# Patient Record
Sex: Female | Born: 1990 | Race: White | Hispanic: No | Marital: Single | Smoking: Never smoker
Health system: Southern US, Community
[De-identification: ages and names within clinical notes are randomized; demographics above are authoritative.]

## PROBLEM LIST (undated history)

## (undated) DIAGNOSIS — L509 Urticaria, unspecified: Secondary | ICD-10-CM

## (undated) DIAGNOSIS — N809 Endometriosis, unspecified: Secondary | ICD-10-CM

## (undated) DIAGNOSIS — F419 Anxiety disorder, unspecified: Secondary | ICD-10-CM

## (undated) DIAGNOSIS — F32A Depression, unspecified: Secondary | ICD-10-CM

## (undated) DIAGNOSIS — T783XXA Angioneurotic edema, initial encounter: Secondary | ICD-10-CM

## (undated) DIAGNOSIS — J45909 Unspecified asthma, uncomplicated: Secondary | ICD-10-CM

## (undated) DIAGNOSIS — F329 Major depressive disorder, single episode, unspecified: Secondary | ICD-10-CM

## (undated) HISTORY — DX: Urticaria, unspecified: L50.9

## (undated) HISTORY — DX: Angioneurotic edema, initial encounter: T78.3XXA

## (undated) HISTORY — DX: Unspecified asthma, uncomplicated: J45.909

## (undated) HISTORY — PX: EXTERNAL EAR SURGERY: SHX627

## (undated) HISTORY — DX: Anxiety disorder, unspecified: F41.9

---
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
## 2010-04-22 ENCOUNTER — Ambulatory Visit: Payer: Self-pay | Admitting: Family Medicine

## 2011-06-08 ENCOUNTER — Ambulatory Visit: Payer: Self-pay

## 2014-03-19 ENCOUNTER — Ambulatory Visit: Payer: Self-pay | Admitting: Physician Assistant

## 2014-04-09 DIAGNOSIS — M25539 Pain in unspecified wrist: Secondary | ICD-10-CM | POA: Insufficient documentation

## 2014-05-15 DIAGNOSIS — S6980XA Other specified injuries of unspecified wrist, hand and finger(s), initial encounter: Secondary | ICD-10-CM

## 2014-05-15 HISTORY — DX: Other specified injuries of unspecified wrist, hand and finger(s), initial encounter: S69.80XA

## 2015-10-08 DIAGNOSIS — J4520 Mild intermittent asthma, uncomplicated: Secondary | ICD-10-CM | POA: Insufficient documentation

## 2015-10-08 DIAGNOSIS — J4521 Mild intermittent asthma with (acute) exacerbation: Secondary | ICD-10-CM | POA: Insufficient documentation

## 2017-01-01 ENCOUNTER — Ambulatory Visit
Admission: EM | Admit: 2017-01-01 | Discharge: 2017-01-01 | Disposition: A | Discharge status: Home / Self Care | Payer: BLUE CROSS/BLUE SHIELD | Attending: Family Medicine | Admitting: Family Medicine

## 2017-01-01 ENCOUNTER — Encounter: Payer: Self-pay | Admitting: Emergency Medicine

## 2017-01-01 DIAGNOSIS — B9689 Other specified bacterial agents as the cause of diseases classified elsewhere: Secondary | ICD-10-CM | POA: Diagnosis not present

## 2017-01-01 DIAGNOSIS — N390 Urinary tract infection, site not specified: Secondary | ICD-10-CM

## 2017-01-01 DIAGNOSIS — N760 Acute vaginitis: Secondary | ICD-10-CM | POA: Diagnosis not present

## 2017-01-01 LAB — URINALYSIS, COMPLETE (UACMP) WITH MICROSCOPIC
Bilirubin Urine: NEGATIVE
GLUCOSE, UA: NEGATIVE mg/dL
KETONES UR: NEGATIVE mg/dL
Nitrite: NEGATIVE
PROTEIN: 30 mg/dL — AB
Specific Gravity, Urine: >1.03 — ABNORMAL HIGH (ref 1.005–1.030)
pH: 7 (ref 5.0–8.0)

## 2017-01-01 LAB — WET PREP, GENITAL
Sperm: NONE SEEN
Trich, Wet Prep: NONE SEEN
Yeast Wet Prep HPF POC: NONE SEEN

## 2017-01-01 LAB — CHLAMYDIA/NGC RT PCR (ARMC ONLY)
Chlamydia Tr: NOT DETECTED
N gonorrhoeae: NOT DETECTED

## 2017-01-01 MED ORDER — CEPHALEXIN 500 MG PO CAPS: 500.0000 mg | ORAL_CAPSULE | Freq: Two times a day (BID) | ORAL | 0 refills | Status: DC

## 2017-01-01 MED ORDER — TRIAMCINOLONE ACETONIDE 0.1 % EX CREA: 1.0000 "application " | TOPICAL_CREAM | Freq: Two times a day (BID) | CUTANEOUS | 0 refills | Status: DC

## 2017-01-01 MED ORDER — METRONIDAZOLE 500 MG PO TABS: 500.0000 mg | ORAL_TABLET | Freq: Two times a day (BID) | ORAL | 0 refills | Status: DC

## 2017-01-01 NOTE — ED Triage Notes (Signed)
Patient c/o burning when urinating and vaginal itching and irritation that started 2 days ago.

## 2017-01-01 NOTE — ED Provider Notes (Signed)
CSN: [id number]     Arrival date & time 01/01/17  0915 History   First MD Initiated Contact with Patient 01/01/17 1006     Chief Complaint  Patient presents with  . Vaginal Itching   (Consider location/radiation/quality/duration/timing/severity/associated sxs/prior Treatment) HPI  26 year old female who presents with burning when urinating along with vaginal itching and irritation with white creamy discharge that started 2 days ago. Says she has burning with urination she states is severe. She has had one partner sexually active. Does admit to dyspareunia recently during the infection. She did have a fever earlier in the illness to 100.4        History reviewed. No pertinent past medical history. Past Surgical History:  Procedure Laterality Date  . EXTERNAL EAR SURGERY     History reviewed. No pertinent family history. Social History  Substance Use Topics  . Smoking status: Never Smoker  . Smokeless tobacco: Never Used  . Alcohol use Yes   OB History    No data available     Review of Systems  Constitutional: Positive for activity change, chills and fever.  Genitourinary: Positive for dysuria, frequency, urgency, vaginal discharge and vaginal pain.  All other systems reviewed and are negative.   Allergies  Patient has no known allergies.  Home Medications   Prior to Admission medications   Medication Sig Start Date End Date Taking? Authorizing Provider  escitalopram (LEXAPRO) 20 MG tablet Take 20 mg by mouth daily.   Yes Historical Provider, MD  cephALEXin (KEFLEX) 500 MG capsule Take 1 capsule (500 mg total) by mouth 2 (two) times daily. 01/01/17   Lutricia Feil, PA-C  metroNIDAZOLE (FLAGYL) 500 MG tablet Take 1 tablet (500 mg total) by mouth 2 (two) times daily. 01/01/17   Lutricia Feil, PA-C  triamcinolone cream (KENALOG) 0.1 % Apply 1 application topically 2 (two) times daily. 01/01/17   Lutricia Feil, PA-C   Meds Ordered and Administered this Visit   Medications - No data to display  BP 125/80 (BP Location: Left Arm)   Pulse 95   Temp 98.4 F (36.9 C) (Oral)   Resp 16   Ht 5\' 1"  (1.549 m)   Wt 110 lb (49.9 kg)   LMP 12/16/2016 (Exact Date)   SpO2 100%   BMI 20.78 kg/m  No data found.   Physical Exam  Constitutional: She is oriented to person, place, and time. She appears well-developed and well-nourished. No distress.  HENT:  Head: Normocephalic and atraumatic.  Eyes: Pupils are equal, round, and reactive to light.  Neck: Normal range of motion.  Abdominal: Soft. Bowel sounds are normal. She exhibits no distension. There is no tenderness. There is no rebound and no guarding.  Genitourinary: Uterus normal. Vaginal discharge found.  Genitourinary Comments: Pelvic exam was performed with Herbert Seta, RN as chaperone and assisted. External genitalia so showed discharge at the introitus which was creamy and thick. She was extremely irritated at the introitus and on the labia. No vesicles were seen. Pelvic exam showed copious amount of white thick discharge This covered the fornix and the cervical os. Samples were obtained and sent to the laboratory. A speculum was removed and a bimanual showed no cervical motion tenderness no adnexal tenderness. The patient complained of severe pain over the external genitalia during the bi manual.  Musculoskeletal: Normal range of motion.  Neurological: She is alert and oriented to person, place, and time.  Skin: Skin is warm and dry. She is not diaphoretic.  Psychiatric:  She has a normal mood and affect. Her behavior is normal. Judgment and thought content normal.  Nursing note and vitals reviewed.   Urgent Care Course     Procedures (including critical care time)  Labs Review Labs Reviewed  WET PREP, GENITAL - Abnormal; Notable for the following:       Result Value   Clue Cells Wet Prep HPF POC PRESENT (*)    WBC, Wet Prep HPF POC FEW (*)    All other components within normal limits   URINALYSIS, COMPLETE (UACMP) WITH MICROSCOPIC - Abnormal; Notable for the following:    APPearance CLOUDY (*)    Specific Gravity, Urine >1.030 (*)    Hgb urine dipstick SMALL (*)    Protein, ur 30 (*)    Leukocytes, UA LARGE (*)    Squamous Epithelial / LPF TOO NUMEROUS TO COUNT (*)    Bacteria, UA FEW (*)    All other components within normal limits  CHLAMYDIA/NGC RT PCR ([facility] ONLY)  URINE CULTURE    Imaging Review No results found.   Visual Acuity Review  Right Eye Distance:   Left Eye Distance:   Bilateral Distance:    Right Eye Near:   Left Eye Near:    Bilateral Near:         MDM   1. BV (bacterial vaginosis)   2. Urinary tract infection without hematuria, site unspecified    Discharge Medication List as of 01/01/2017 11:20 AM    START taking these medications   Details  cephALEXin (KEFLEX) 500 MG capsule Take 1 capsule (500 mg total) by mouth 2 (two) times daily., Starting Fri 01/01/2017, Normal    metroNIDAZOLE (FLAGYL) 500 MG tablet Take 1 tablet (500 mg total) by mouth 2 (two) times daily., Starting Fri 01/01/2017, Normal    triamcinolone cream (KENALOG) 0.1 % Apply 1 application topically 2 (two) times daily., Starting Fri 01/01/2017, Normal      Plan: 1. Test/x-ray results and diagnosis reviewed with patient 2. rx as per orders; risks, benefits, potential side effects reviewed with patient 3. Recommend supportive treatment with abstain from sex during treatment. Regarding the use of alcohol while taking Flagyl. She is not improving she should follow-up with the GYN.  4. F/u prn if symptoms worsen or don't improve     Lutricia FeilWilliam P Roemer, PA-C 01/01/17 2020

## 2017-01-02 LAB — URINE CULTURE: Culture: <10000 — AB

## 2017-01-03 DIAGNOSIS — N7689 Other specified inflammation of vagina and vulva: Secondary | ICD-10-CM | POA: Insufficient documentation

## 2017-01-03 DIAGNOSIS — R102 Pelvic and perineal pain: Secondary | ICD-10-CM | POA: Diagnosis present

## 2017-01-03 NOTE — ED Triage Notes (Addendum)
Pt was seen at [facility] 2 days ago and diagnosed with bacterial vaginosis and UTI; taking medication as prescribed but not feeling any better; c/o continued dysuria, dizziness and tingling in arms; pt awake and oriented x 3; talking in complete coherent sentences

## 2017-01-04 ENCOUNTER — Encounter: Payer: Self-pay | Admitting: Emergency Medicine

## 2017-01-04 ENCOUNTER — Emergency Department
Admission: EM | Admit: 2017-01-04 | Discharge: 2017-01-04 | Disposition: A | Discharge status: Home / Self Care | Payer: BLUE CROSS/BLUE SHIELD | Attending: Emergency Medicine | Admitting: Emergency Medicine

## 2017-01-04 DIAGNOSIS — L989 Disorder of the skin and subcutaneous tissue, unspecified: Secondary | ICD-10-CM

## 2017-01-04 DIAGNOSIS — N7689 Other specified inflammation of vagina and vulva: Secondary | ICD-10-CM

## 2017-01-04 LAB — CBC WITH DIFFERENTIAL/PLATELET
BASOS ABS: 0 10*3/uL (ref 0–0.1)
BASOS PCT: 1 %
EOS ABS: 0.1 10*3/uL (ref 0–0.7)
EOS PCT: 2 %
HCT: 40.2 % (ref 35.0–47.0)
Hemoglobin: 13.6 g/dL (ref 12.0–16.0)
Lymphocytes Relative: 33 %
Lymphs Abs: 1.7 10*3/uL (ref 1.0–3.6)
MCH: 29.7 pg (ref 26.0–34.0)
MCHC: 33.8 g/dL (ref 32.0–36.0)
MCV: 88.1 fL (ref 80.0–100.0)
MONO ABS: 0.7 10*3/uL (ref 0.2–0.9)
Monocytes Relative: 13 %
Neutro Abs: 2.6 10*3/uL (ref 1.4–6.5)
Neutrophils Relative %: 51 %
PLATELETS: 209 10*3/uL (ref 150–440)
RBC: 4.57 MIL/uL (ref 3.80–5.20)
RDW: 13.8 % (ref 11.5–14.5)
WBC: 5.1 10*3/uL (ref 3.6–11.0)

## 2017-01-04 LAB — BASIC METABOLIC PANEL
Anion gap: 5 (ref 5–15)
BUN: 17 mg/dL (ref 6–20)
CALCIUM: 8.9 mg/dL (ref 8.9–10.3)
CHLORIDE: 107 mmol/L (ref 101–111)
CO2: 26 mmol/L (ref 22–32)
Creatinine, Ser: 0.67 mg/dL (ref 0.44–1.00)
GFR calc Af Amer: >60 mL/min (ref >60)
Glucose, Bld: 109 mg/dL — ABNORMAL HIGH (ref 65–99)
Potassium: 4.1 mmol/L (ref 3.5–5.1)
SODIUM: 138 mmol/L (ref 135–145)

## 2017-01-04 MED ORDER — HYDROCODONE-ACETAMINOPHEN 5-325 MG PO TABS
1.0000 | ORAL_TABLET | Freq: Once | ORAL | Status: AC
  Administered 2017-01-04: 1 via ORAL
  Filled 2017-01-04: qty 1

## 2017-01-04 MED ORDER — PREDNISONE 20 MG PO TABS: ORAL_TABLET | ORAL | 0 refills | Status: DC

## 2017-01-04 MED ORDER — ONDANSETRON 4 MG PO TBDP
4.0000 mg | ORAL_TABLET | Freq: Once | ORAL | Status: AC
  Administered 2017-01-04: 4 mg via ORAL
  Filled 2017-01-04: qty 1

## 2017-01-04 MED ORDER — HYDROCODONE-ACETAMINOPHEN 5-325 MG PO TABS: 1.0000 | ORAL_TABLET | Freq: Four times a day (QID) | ORAL | 0 refills | Status: DC | PRN

## 2017-01-04 MED ORDER — PREDNISONE 20 MG PO TABS
40.0000 mg | ORAL_TABLET | Freq: Once | ORAL | Status: AC
  Administered 2017-01-04: 40 mg via ORAL
  Filled 2017-01-04: qty 2

## 2017-01-04 NOTE — ED Provider Notes (Signed)
[facility] Emergency Department Provider Note   ____________________________________________   First MD Initiated Contact with Patient 01/04/17 0315     (approximate)  I have reviewed the triage vital signs and the nursing notes.   HISTORY  Chief Complaint Dysuria    HPI Regina Macias is a 26 y.o. female who presents to the ED from home with a chief complaint of dysuria and vaginal discomfort. Patient was seen at [facility] 2 days ago and diagnosed with bacterial vaginosis and UTI. States she is taking Keflex and Flagyl as prescribed. Last evening as she was applying the Kenalog cream she was prescribed, she had intense vaginal pain and felt dizzy and tingling in both forearms. The symptoms subsequently resolved and she has not applied the cream since. Complains of pain on urination from external contact of urine to her genitals. Feels itchy but does not scratch secondary to discomfort. Denies fever, chills, chest pain, shortness of breath, abdominal pain, nausea, vomiting, diarrhea. Endorses dysparuenia with recent sexual activity. Denies recent condom use.Does state she and her partner used a lubricant which she has had some irritation to in the past.   Past medical history None  There are no active problems to display for this patient.   Past Surgical History:  Procedure Laterality Date  . EXTERNAL EAR SURGERY      Prior to Admission medications   Medication Sig Start Date End Date Taking? Authorizing Provider  cephALEXin (KEFLEX) 500 MG capsule Take 1 capsule (500 mg total) by mouth 2 (two) times daily. 01/01/17  Yes Lutricia FeilWilliam P Roemer, PA-C  escitalopram (LEXAPRO) 20 MG tablet Take 20 mg by mouth daily.   Yes Historical Provider, MD  metroNIDAZOLE (FLAGYL) 500 MG tablet Take 1 tablet (500 mg total) by mouth 2 (two) times daily. 01/01/17  Yes Lutricia FeilWilliam P Roemer, PA-C  triamcinolone cream (KENALOG) 0.1 % Apply 1 application topically 2 (two) times  daily. 01/01/17  Yes Lutricia FeilWilliam P Roemer, PA-C  HYDROcodone-acetaminophen (NORCO) 5-325 MG tablet Take 1 tablet by mouth every 6 (six) hours as needed for moderate pain. 01/04/17   Irean HongJade J Eldonna Neuenfeldt, MD  predniSONE (DELTASONE) 20 MG tablet 2 tablets daily x 4 days 01/04/17   Irean HongJade J Ivery Michalski, MD    Allergies Patient has no known allergies.  History reviewed. No pertinent family history.  Social History Social History  Substance Use Topics  . Smoking status: Never Smoker  . Smokeless tobacco: Never Used  . Alcohol use Yes    Review of Systems  Constitutional: No fever/chills. Eyes: No visual changes. ENT: No sore throat. Cardiovascular: Denies chest pain. Respiratory: Denies shortness of breath. Gastrointestinal: No abdominal pain.  No nausea, no vomiting.  No diarrhea.  No constipation. Genitourinary: Positive for dysuria and vaginal discomfort. Musculoskeletal: Negative for back pain. Skin: Negative for rash. Neurological: Negative for headaches, focal weakness or numbness.  10-point ROS otherwise negative.  ____________________________________________   PHYSICAL EXAM:  VITAL SIGNS: ED Triage Vitals  Enc Vitals Group     BP 01/04/17 0000 123/76     Pulse Rate 01/04/17 0000 83     Resp 01/04/17 0000 18     Temp 01/04/17 0000 98.2 F (36.8 C)     Temp Source 01/04/17 0000 Oral     SpO2 01/04/17 0000 100 %     Weight 01/04/17 0001 110 lb (49.9 kg)     Height 01/04/17 0001 5\' 1"  (1.549 m)     Head Circumference --  Peak Flow --      Pain Score 01/04/17 0001 7     Pain Loc --      Pain Edu? --      Excl. in GC? --     Constitutional: Alert and oriented. Well appearing and in mild acute distress. Eyes: Conjunctivae are normal. PERRL. EOMI. Head: Atraumatic. Nose: No congestion/rhinnorhea. Mouth/Throat: Mucous membranes are moist.  Oropharynx non-erythematous. Neck: No stridor.   Cardiovascular: Normal rate, regular rhythm. Grossly normal heart sounds.  Good peripheral  circulation. Respiratory: Normal respiratory effort.  No retractions. Lungs CTAB. Gastrointestinal: Soft and nontender. No distention. No abdominal bruits. No CVA tenderness. Genitourinary: External exam reveals moderately swollen perineum, labia, and vulva. No vesicles. Musculoskeletal: No lower extremity tenderness nor edema.  No joint effusions. Neurologic:  Normal speech and language. No gross focal neurologic deficits are appreciated. No gait instability. Skin:  Skin is warm, dry and intact. No rash noted. Psychiatric: Mood and affect are normal. Speech and behavior are normal.  ____________________________________________   LABS (all labs ordered are listed, but only abnormal results are displayed)  Labs Reviewed  BASIC METABOLIC PANEL - Abnormal; Notable for the following:       Result Value   Glucose, Bld 109 (*)    All other components within normal limits  CBC WITH DIFFERENTIAL/PLATELET  URINALYSIS, COMPLETE (UACMP) WITH MICROSCOPIC   ____________________________________________  EKG  None ____________________________________________  RADIOLOGY  None ____________________________________________   PROCEDURES  Procedure(s) performed: None  Procedures  Critical Care performed: No  ____________________________________________   INITIAL IMPRESSION / ASSESSMENT AND PLAN / ED COURSE  Pertinent labs & imaging results that were available during my care of the patient were reviewed by me and considered in my medical decision making (see chart for details).  26 year old female who presents with vaginal discomfort secondarily to swollen perineum, most likely localized allergic reaction from lubricant. Encouraged patient to discontinue topical cream. Will treat with low-dose prednisone burst, analgesia and ice packs. She is to follow up closely with gynecology. Strict return precautions given. Patient and significant other verbalized understanding and agree with plan  of care.      ____________________________________________   FINAL CLINICAL IMPRESSION(S) / ED DIAGNOSES  Final diagnoses:  Perineal irritation in female  Other specified inflammation of vagina and vulva      NEW MEDICATIONS STARTED DURING THIS VISIT:  New Prescriptions   HYDROCODONE-ACETAMINOPHEN (NORCO) 5-325 MG TABLET    Take 1 tablet by mouth every 6 (six) hours as needed for moderate pain.   PREDNISONE (DELTASONE) 20 MG TABLET    2 tablets daily x 4 days     Note:  This document was prepared using Dragon voice recognition software and may include unintentional dictation errors.    Irean Hong, MD 01/04/17 [phone]

## 2017-01-04 NOTE — Discharge Instructions (Signed)
1. You have a large amount of swelling to the affected area which is likely an allergic reaction. You may use the perineal ice packs provided to decrease swelling and provided relief of symptoms. 2. Do not apply any creams or lotions topically to the affected area. 3. Continue and finish your antibiotics. 4. Finish prednisone 40 mg daily 4 days. Start your next dose on Tuesday. 5. You may take Motrin as needed for pain, Norco as needed for more severe discomfort. 6. You may take Benadryl as needed for itching. 7. Return to the ER for worsening symptoms, persistent vomiting, fever, difficult breathing or other concerns.

## 2017-11-08 ENCOUNTER — Ambulatory Visit
Admission: EM | Admit: 2017-11-08 | Discharge: 2017-11-08 | Disposition: A | Discharge status: Home / Self Care | Payer: BLUE CROSS/BLUE SHIELD | Attending: Emergency Medicine | Admitting: Emergency Medicine

## 2017-11-08 ENCOUNTER — Other Ambulatory Visit: Payer: Self-pay

## 2017-11-08 ENCOUNTER — Encounter: Payer: Self-pay | Admitting: *Deleted

## 2017-11-08 DIAGNOSIS — B373 Candidiasis of vulva and vagina: Secondary | ICD-10-CM

## 2017-11-08 DIAGNOSIS — B3731 Acute candidiasis of vulva and vagina: Secondary | ICD-10-CM

## 2017-11-08 HISTORY — DX: Major depressive disorder, single episode, unspecified: F32.9

## 2017-11-08 HISTORY — DX: Depression, unspecified: F32.A

## 2017-11-08 LAB — WET PREP, GENITAL
CLUE CELLS WET PREP: NONE SEEN
Sperm: NONE SEEN
TRICH WET PREP: NONE SEEN

## 2017-11-08 LAB — URINALYSIS, COMPLETE (UACMP) WITH MICROSCOPIC
BACTERIA UA: NONE SEEN
Bilirubin Urine: NEGATIVE
Glucose, UA: NEGATIVE mg/dL
Hgb urine dipstick: NEGATIVE
KETONES UR: NEGATIVE mg/dL
Leukocytes, UA: NEGATIVE
Nitrite: NEGATIVE
PH: 7.5 (ref 5.0–8.0)
Protein, ur: NEGATIVE mg/dL
RBC / HPF: NONE SEEN RBC/hpf (ref 0–5)
SPECIFIC GRAVITY, URINE: 1.02 (ref 1.005–1.030)

## 2017-11-08 LAB — PREGNANCY, URINE: PREG TEST UR: NEGATIVE

## 2017-11-08 MED ORDER — FLUCONAZOLE 150 MG PO TABS: 150.0000 mg | ORAL_TABLET | Freq: Once | ORAL | 1 refills | Status: AC

## 2017-11-08 NOTE — Discharge Instructions (Signed)
Your wet prep came back positive for yeast, came back negative for BV or Trichomonas.  You do not have a urinary tract infection.  Take the medication as written.  Refrain from sexual contact until your symptoms resolve.  Go to www.goodrx.com to look up your medications. This will give you a list of where you can find your prescriptions at the most affordable prices. Or ask the pharmacist what the cash price is, or if they have any other discount programs available to help make your medication more affordable. This can be less expensive than what you would pay with insurance.

## 2017-11-08 NOTE — ED Triage Notes (Signed)
Patient started having symptoms of vaginal bleeding and irritation 1.5 weeks ago. Patient has a history of vaginal discomfort.

## 2017-11-08 NOTE — ED Provider Notes (Signed)
HPI  SUBJECTIVE:  Regina Macias is a 27 y.o. female who presents with vaginal itching, burning, clear white discharge for the past week and a half.  States that she had the symptoms before starting menses and then they got worse afterwards.  She reports urinary urgency, frequency.  She tried Azo yeast without improvement of symptoms.  No aggravating factors.  Denies vaginal bleeding. no odor, genital rash,  fevers, abdominal, back, pelvic pain.  No dysuria, cloudy odorous urine, hematuria.  She does use a perfumed body wash, but states that this is not new or different.  No antibiotics in the past month.  No antipyretic in the past 6-8 hours.  She is in a long-term monogamous relationship with a female who is asymptomatic, STDs are not a concern today.  She states this feels like previous BV and yeast infections.  She has no history of gonorrhea, chlamydia, HIV, HSV, syphilis, Trichomonas, PID, diabetes, hypertension.  LMP: Last week.  PMD: [facility]   Past Medical History:  Diagnosis Date  . Depression     Past Surgical History:  Procedure Laterality Date  . EXTERNAL EAR SURGERY      Family History  Problem Relation Age of Onset  . Healthy Mother   . Healthy Father     Social History   Tobacco Use  . Smoking status: Never Smoker  . Smokeless tobacco: Never Used  Substance Use Topics  . Alcohol use: Yes  . Drug use: No    No current facility-administered medications for this encounter.   Current Outpatient Medications:  .  escitalopram (LEXAPRO) 20 MG tablet, Take 20 mg by mouth daily., Disp: , Rfl:  .  fluconazole (DIFLUCAN) 150 MG tablet, Take 1 tablet (150 mg total) by mouth once for 1 dose. 1 tab po x 1. May repeat in 72 hours if no improvement, Disp: 2 tablet, Rfl: 1 .  predniSONE (DELTASONE) 20 MG tablet, 2 tablets daily x 4 days, Disp: 8 tablet, Rfl: 0  Allergies  Allergen Reactions  . Kenalog [Triamcinolone Acetonide] Rash     ROS  As  noted in HPI.   Physical Exam  BP (!) 118/57 (BP Location: Left Arm)   Pulse 63   Temp 98.4 F (36.9 C) (Oral)   Resp 16   Ht 5\' 1"  (1.549 m)   Wt 106 lb (48.1 kg)   LMP 10/31/2017   SpO2 99%   BMI 20.03 kg/m   Constitutional: Well developed, well nourished, no acute distress Eyes:  EOMI, conjunctiva normal bilaterally HENT: Normocephalic, atraumatic,mucus membranes moist Respiratory: Normal inspiratory effort Cardiovascular: Normal rate GI: nondistended soft, nontender. No suprapubic tenderness  back: No CVA tenderness GU: Deferred skin: No rash, skin intact Musculoskeletal: no deformities Neurologic: Alert & oriented x 3, no focal neuro deficits Psychiatric: Speech and behavior appropriate   ED Course   Medications - No data to display  Orders Placed This Encounter  Procedures  . Wet prep, genital    Standing Status:   Standing    Number of Occurrences:   1    Order Specific Question:   Patient immune status    Answer:   Normal  . Pregnancy, urine    Standing Status:   Standing    Number of Occurrences:   1  . Urinalysis, Complete w Microscopic    Standing Status:   Standing    Number of Occurrences:   1    Results for orders placed or  performed during the hospital encounter of 11/08/17 (from the past 24 hour(s))  Pregnancy, urine     Status: None   Collection Time: 11/08/17  1:04 PM  Result Value Ref Range   Preg Test, Ur NEGATIVE NEGATIVE  Wet prep, genital     Status: Abnormal   Collection Time: 11/08/17  1:04 PM  Result Value Ref Range   Yeast Wet Prep HPF POC PRESENT (A) NONE SEEN   Trich, Wet Prep NONE SEEN NONE SEEN   Clue Cells Wet Prep HPF POC NONE SEEN NONE SEEN   WBC, Wet Prep HPF POC FEW (A) NONE SEEN   Sperm NONE SEEN   Urinalysis, Complete w Microscopic     Status: Abnormal   Collection Time: 11/08/17  1:04 PM  Result Value Ref Range   Color, Urine YELLOW YELLOW   APPearance CLEAR CLEAR   Specific Gravity, Urine 1.020 1.005 - 1.030    pH 7.5 5.0 - 8.0   Glucose, UA NEGATIVE NEGATIVE mg/dL   Hgb urine dipstick NEGATIVE NEGATIVE   Bilirubin Urine NEGATIVE NEGATIVE   Ketones, ur NEGATIVE NEGATIVE mg/dL   Protein, ur NEGATIVE NEGATIVE mg/dL   Nitrite NEGATIVE NEGATIVE   Leukocytes, UA NEGATIVE NEGATIVE   Squamous Epithelial / LPF 6-30 (A) NONE SEEN   WBC, UA 0-5 0 - 5 WBC/hpf   RBC / HPF NONE SEEN 0 - 5 RBC/hpf   Bacteria, UA NONE SEEN NONE SEEN   No results found.  ED Clinical Impression  Yeast vaginitis   ED Assessment/Plan   She is in a long-term monogamous relationship with her boyfriend, who is here and is asymptomatic.  Think that STDs are a very low risk today and they are not concerned about this, so STD testing was not done.  We will however check a wet prep, UA and urine pregnancy.  H&P most c/w yeast infection .  Patient does not have a UTI.  U pregnant negative.   Will send home with diflucan for yeast infection. Advised pt to refrain from sexual contact until symptoms resolve. Follow-up with PMD as needed. Discussed labs, MDM, plan and followup with patient. Pt agrees with plan.   Meds ordered this encounter  Medications  . fluconazole (DIFLUCAN) 150 MG tablet    Sig: Take 1 tablet (150 mg total) by mouth once for 1 dose. 1 tab po x 1. May repeat in 72 hours if no improvement    Dispense:  2 tablet    Refill:  1    *This clinic note was created using Scientist, clinical (histocompatibility and immunogenetics). Therefore, there may be occasional mistakes despite careful proofreading.  ?    Domenick Gong, MD 11/08/17 1336

## 2017-11-11 ENCOUNTER — Telehealth: Payer: Self-pay

## 2017-11-11 NOTE — Telephone Encounter (Signed)
Called to follow up with patient since visit here at [facility]. Patient instructed to call back with any questions or concerns. MAH  

## 2017-11-29 DIAGNOSIS — Z0000 Encounter for general adult medical examination without abnormal findings: Secondary | ICD-10-CM | POA: Insufficient documentation

## 2017-11-29 DIAGNOSIS — N946 Dysmenorrhea, unspecified: Secondary | ICD-10-CM | POA: Insufficient documentation

## 2017-12-15 ENCOUNTER — Ambulatory Visit
Admission: EM | Admit: 2017-12-15 | Discharge: 2017-12-15 | Disposition: A | Discharge status: Home / Self Care | Payer: BLUE CROSS/BLUE SHIELD | Attending: Family Medicine | Admitting: Family Medicine

## 2017-12-15 ENCOUNTER — Ambulatory Visit (INDEPENDENT_AMBULATORY_CARE_PROVIDER_SITE_OTHER): Payer: BLUE CROSS/BLUE SHIELD

## 2017-12-15 ENCOUNTER — Encounter: Payer: Self-pay | Admitting: Gynecology

## 2017-12-15 ENCOUNTER — Other Ambulatory Visit: Payer: Self-pay

## 2017-12-15 DIAGNOSIS — S93401A Sprain of unspecified ligament of right ankle, initial encounter: Secondary | ICD-10-CM

## 2017-12-15 DIAGNOSIS — S9001XA Contusion of right ankle, initial encounter: Secondary | ICD-10-CM

## 2017-12-15 DIAGNOSIS — W108XXA Fall (on) (from) other stairs and steps, initial encounter: Secondary | ICD-10-CM | POA: Diagnosis not present

## 2017-12-15 DIAGNOSIS — T07XXXA Unspecified multiple injuries, initial encounter: Secondary | ICD-10-CM

## 2017-12-15 NOTE — ED Provider Notes (Signed)
[facility] ____________________________________________  Time seen: Approximately 3:41 PM  I have reviewed the triage vital signs and the nursing notes.   HISTORY  Chief Complaint Ankle Injury   HPI Regina Macias is a 27 y.o. female presenting for evaluation of right ankle and left shin pain post injury that occurred yesterday evening.  Patient states that as she was coming in to her house carrying other items, her dog ran out of the house causing her to roll her right ankle and fall.  States fell down 3 concrete steps and feels that she hit her right lateral ankle and anterior left shin on the concrete.  Denies head injury, loss of conscious or other pain or injuries.  His continue remain ambulatory but with pain with ambulation, primarily to right ankle.  Denies history of same injuries in the past.  Denies pain radiation or paresthesias.  States pain to right ankle is constant and worse with ambulation.  States pain to left shin is more mild but worse with ambulation.  No alleviating measures attempted prior to arrival. Denies recent sickness. Denies recent antibiotic use.   Patient's last menstrual period was 10/27/2017. Denies pregnancy.    Past Medical History:  Diagnosis Date  . Depression     There are no active problems to display for this patient.   Past Surgical History:  Procedure Laterality Date  . EXTERNAL EAR SURGERY       No current facility-administered medications for this encounter.   Current Outpatient Medications:  .  escitalopram (LEXAPRO) 20 MG tablet, Take 20 mg by mouth daily., Disp: , Rfl:   Allergies Kenalog [triamcinolone acetonide]  Family History  Problem Relation Age of Onset  . Healthy Mother   . Healthy Father     Social History Social History   Tobacco Use  . Smoking status: Never Smoker  . Smokeless tobacco: Never Used  Substance Use Topics  . Alcohol use: Yes  . Drug use: No    Review of  Systems Constitutional: No fever/chills Cardiovascular: Denies chest pain. Respiratory: Denies shortness of breath. Gastrointestinal: No abdominal pain.   Musculoskeletal: Negative for back pain. As above.   ____________________________________________   PHYSICAL EXAM:  VITAL SIGNS: ED Triage Vitals  Enc Vitals Group     BP 12/15/17 1505 112/63     Pulse Rate 12/15/17 1505 68     Resp 12/15/17 1505 16     Temp 12/15/17 1505 98.7 F (37.1 C)     Temp Source 12/15/17 1505 Oral     SpO2 12/15/17 1505 100 %     Weight 12/15/17 1507 110 lb (49.9 kg)     Height --      Head Circumference --      Peak Flow --      Pain Score 12/15/17 1507 5     Pain Loc --      Pain Edu? --      Excl. in GC? --     Constitutional: Alert and oriented. Well appearing and in no acute distress. Cardiovascular: Normal rate, regular rhythm. Grossly normal heart sounds.  Good peripheral circulation. Respiratory: Normal respiratory effort without tachypnea nor retractions. Breath sounds are clear and equal bilaterally. No wheezes, rales, rhonchi. Musculoskeletal: No midline cervical, thoracic or lumbar tenderness to palpation. Bilateral pedal pulses equal and easily palpated. Except: Right lateral malleolus mild to moderate point tenderness, no surrounding edema or ecchymosis, mild tenderness and ecchymosis present anteriorly ATFL, pain increases with ankle rotation as  well as resisted plantar flexion and dorsiflexion, normal distal sensation and capillary refill, right lower extremity otherwise nontender. Except: Left anterior mid shin mild tenderness to direct tibia palpation, mild ecchymosis, no erythema, skin intact, no posterior lateral tenderness, full range of motion present to left lower extremity, minimal pain with heel striking.  Ambulatory with antalgic gait. Neurologic:  Normal speech and language. Speech is normal. No gait instability.  Skin:  Skin is warm, dry and intact. No rash  noted. Psychiatric: Mood and affect are normal. Speech and behavior are normal. Patient exhibits appropriate insight and judgment   ___________________________________________   LABS (all labs ordered are listed, but only abnormal results are displayed)  Labs Reviewed - No data to display ____________________________________________  RADIOLOGY  Dg Ankle Complete Right  Result Date: 12/15/2017 CLINICAL DATA:  Fall 2 days ago, lateral ankle pain. EXAM: RIGHT ANKLE - COMPLETE 3+ VIEW COMPARISON:  None. FINDINGS: No malleolar fracture observed. Plafond and talar dome intact. No significant soft tissue swelling overlying the malleolar I. Small well corticated ossicle along the dorsal navicular is thought to be chronic and incidental. No tibiotalar joint effusion is identified. IMPRESSION: 1. No acute bony findings. Electronically Signed   By: Gaylyn RongWalter  Liebkemann M.D.   On: 12/15/2017 16:01   ____________________________________________   PROCEDURES Procedures   INITIAL IMPRESSION / ASSESSMENT AND PLAN / ED COURSE  Pertinent labs & imaging results that were available during my care of the patient were reviewed by me and considered in my medical decision making (see chart for details).  Well-appearing patient.  No acute distress.  Mechanical injury that occurred yesterday, presented for primarily right ankle pain.  Also with left shin pain, patient feels that she only has bruising there and deferred x-ray evaluation of that area at that time.  Right ankle x-ray evaluated, as above per radiologist and reviewed by myself, no acute bony findings.  Suspect sprain and contusion injuries.  Encouraged rest, ice, supportive care.  Ankle splint applied.  Gradual increase activity as tolerated.  Discussed follow up with Primary care physician this week. Discussed follow up and return parameters including no resolution or any worsening concerns. Patient verbalized understanding and agreed to plan.    ____________________________________________   FINAL CLINICAL IMPRESSION(S) / ED DIAGNOSES  Final diagnoses:  Sprain of right ankle, unspecified ligament, initial encounter  Contusion of right ankle, initial encounter  Multiple contusions     ED Discharge Orders    None       Note: This dictation was prepared with Dragon dictation along with smaller phrase technology. Any transcriptional errors that result from this process are unintentional.         Renford DillsMiller, Jaxsun Ciampi, NP 12/15/17 1717

## 2017-12-15 NOTE — ED Triage Notes (Signed)
Patient c/o fell off her front porch going after her dog x 2 days ago. Pr. Stated now with right ankle and left shin  Pain.

## 2017-12-15 NOTE — Discharge Instructions (Signed)
Rest. Ice. Gradually increase activity as tolerated.  ° °Follow up with your primary care physician this week as needed. Return to Urgent care for new or worsening concerns.  ° °

## 2019-04-19 ENCOUNTER — Telehealth: Payer: BC Managed Care – PPO | Admitting: Nurse Practitioner

## 2019-04-19 ENCOUNTER — Telehealth: Payer: BLUE CROSS/BLUE SHIELD

## 2019-04-19 DIAGNOSIS — B3731 Acute candidiasis of vulva and vagina: Secondary | ICD-10-CM

## 2019-04-19 DIAGNOSIS — B373 Candidiasis of vulva and vagina: Secondary | ICD-10-CM | POA: Diagnosis not present

## 2019-04-19 MED ORDER — FLUCONAZOLE 150 MG PO TABS: 150.0000 mg | ORAL_TABLET | Freq: Once | ORAL | 0 refills | Status: AC

## 2019-04-19 NOTE — Progress Notes (Signed)

## 2019-12-25 DIAGNOSIS — S66912A Strain of unspecified muscle, fascia and tendon at wrist and hand level, left hand, initial encounter: Secondary | ICD-10-CM

## 2019-12-25 HISTORY — DX: Strain of unspecified muscle, fascia and tendon at wrist and hand level, left hand, initial encounter: S66.912A

## 2020-10-22 ENCOUNTER — Other Ambulatory Visit: Payer: BC Managed Care – PPO

## 2020-10-22 DIAGNOSIS — Z20822 Contact with and (suspected) exposure to covid-19: Secondary | ICD-10-CM

## 2020-10-23 LAB — SPECIMEN STATUS REPORT

## 2020-10-23 LAB — NOVEL CORONAVIRUS, NAA: SARS-CoV-2, NAA: NOT DETECTED

## 2020-10-23 LAB — SARS-COV-2, NAA 2 DAY TAT

## 2020-10-27 ENCOUNTER — Other Ambulatory Visit: Payer: BC Managed Care – PPO

## 2020-10-27 DIAGNOSIS — Z20822 Contact with and (suspected) exposure to covid-19: Secondary | ICD-10-CM

## 2020-10-30 LAB — NOVEL CORONAVIRUS, NAA: SARS-CoV-2, NAA: NOT DETECTED

## 2020-11-02 ENCOUNTER — Other Ambulatory Visit: Payer: BC Managed Care – PPO

## 2020-11-03 ENCOUNTER — Other Ambulatory Visit: Payer: BC Managed Care – PPO

## 2020-11-10 ENCOUNTER — Other Ambulatory Visit: Payer: BC Managed Care – PPO

## 2020-11-17 ENCOUNTER — Other Ambulatory Visit: Payer: BC Managed Care – PPO

## 2021-11-03 ENCOUNTER — Other Ambulatory Visit: Payer: Self-pay

## 2021-11-03 ENCOUNTER — Ambulatory Visit
Admission: EM | Admit: 2021-11-03 | Discharge: 2021-11-03 | Disposition: A | Discharge status: Home / Self Care | Payer: 59

## 2021-11-03 ENCOUNTER — Ambulatory Visit (INDEPENDENT_AMBULATORY_CARE_PROVIDER_SITE_OTHER): Payer: 59

## 2021-11-03 DIAGNOSIS — S92425A Nondisplaced fracture of distal phalanx of left great toe, initial encounter for closed fracture: Secondary | ICD-10-CM

## 2021-11-03 DIAGNOSIS — Y93B3 Activity, free weights: Secondary | ICD-10-CM | POA: Diagnosis not present

## 2021-11-03 DIAGNOSIS — W208XXA Other cause of strike by thrown, projected or falling object, initial encounter: Secondary | ICD-10-CM

## 2021-11-03 HISTORY — DX: Endometriosis, unspecified: N80.9

## 2021-11-03 NOTE — ED Triage Notes (Signed)
Pt reports working out at the gym, around 2:30 pm 30 lbs weight fell off rack and landed on pt's left great toe, pt denies landing on her entire foot.  Painful to ambulate and apply pressure, swelling, and bruising noted.   Pt has tired ice therapy for pain relief

## 2021-11-03 NOTE — Discharge Instructions (Addendum)
Your x-ray today showed a fracture ( break in bone) of left big toe   Your first and second toe have been taped together and we have been given a hard shoe to protect your injury and prevent further damage. Leave in place until seen by orthopedic specialist. Do not put objects into splint to scratch skin. If numbness or tingling occurs after placement please return to Urgent Care for evaluation.   Follow up with orthopedic specialist in 1-2 weeks. Call practice to make appointment. Information listed below. You may go to any orthopedic provider you deem fit.  Please do not put weight on fracture.   You may take 800 mg of ibuprofen every 8 hours to help reduce swelling and for pain

## 2021-11-03 NOTE — ED Provider Notes (Signed)
[facility]    CSN: [id number] Arrival date & time: 11/03/21  1824      History   Chief Complaint Chief Complaint  Patient presents with   Toe Injury    HPI Regina Macias is a 31 y.o. female.   Patient presents with left great toe pain, swelling, bruising and numbness beginning today after she dropped a 30 pound dumbbell directly onto the toilet.  Limited range of motion.  Painful to bear weight.  Has not attempted treatment.  No pertinent medical history.  Past Medical History:  Diagnosis Date   Depression    Endometriosis     There are no problems to display for this patient.   Past Surgical History:  Procedure Laterality Date   EXTERNAL EAR SURGERY      OB History   No obstetric history on file.      Home Medications    Prior to Admission medications   Medication Sig Start Date End Date Taking? Authorizing Provider  escitalopram (LEXAPRO) 20 MG tablet Take 20 mg by mouth daily.   Yes [provider]  norethindrone-ethinyl estradiol (VYFEMLA) 0.4-35 MG-MCG tablet Take 1 tablet by mouth daily.   Yes [provider]    Family History Family History  Problem Relation Age of Onset   Healthy Mother    Healthy Father     Social History Social History   Tobacco Use   Smoking status: Never   Smokeless tobacco: Never  Vaping Use   Vaping Use: Never used  Substance Use Topics   Alcohol use: Yes   Drug use: No     Allergies   Kenalog [triamcinolone acetonide]   Review of Systems Review of Systems  Constitutional: Negative.   HENT: Negative.    Musculoskeletal:  Positive for joint swelling. Negative for arthralgias, back pain, gait problem, myalgias, neck pain and neck stiffness.  Skin: Negative.   Neurological: Negative.     Physical Exam Triage Vital Signs ED Triage Vitals  Enc Vitals Group     BP 11/03/21 1833 (!) 141/98     Pulse Rate 11/03/21 1833 77     Resp 11/03/21 1833 16     Temp 11/03/21 1833  98.6 F (37 C)     Temp Source 11/03/21 1833 Oral     SpO2 11/03/21 1833 98 %     Weight 11/03/21 1834 140 lb (63.5 kg)     Height 11/03/21 1834 5\' 1"  (1.549 m)     Head Circumference --      Peak Flow --      Pain Score 11/03/21 1833 8     Pain Loc --      Pain Edu? --      Excl. in GC? --    No data found.  Updated Vital Signs BP (!) 141/98 (BP Location: Left Arm)    Pulse 77    Temp 98.6 F (37 C) (Oral)    Resp 16    Ht 5\' 1"  (1.549 m)    Wt 140 lb (63.5 kg)    SpO2 98%    BMI 26.45 kg/m   Visual Acuity Right Eye Distance:   Left Eye Distance:   Bilateral Distance:    Right Eye Near:   Left Eye Near:    Bilateral Near:     Physical Exam Constitutional:      Appearance: Normal appearance.  HENT:     Head: Normocephalic.  Eyes:     Extraocular Movements:  Extraocular movements intact.  Pulmonary:     Effort: Pulmonary effort is normal.  Feet:     Comments: Tenderness, mild to moderate swelling and ecchymosis noted at the distal phalanx of the left great toe, limited range of motion, unable to flex, decreased sensation, capillary refill less than 3, 2+ pedal pulse Neurological:     Mental Status: She is alert and oriented to person, place, and time. Mental status is at baseline.  Psychiatric:        Mood and Affect: Mood normal.        Behavior: Behavior normal.     UC Treatments / Results  Labs (all labs ordered are listed, but only abnormal results are displayed) Labs Reviewed - No data to display  EKG   Radiology No results found.  Procedures Procedures (including critical care time)  Medications Ordered in UC Medications - No data to display  Initial Impression / Assessment and Plan / UC Course  I have reviewed the triage vital signs and the nursing notes.  Pertinent labs & imaging results that were available during my care of the patient were reviewed by me and considered in my medical decision making (see chart for details).  Closed  nondisplaced fracture of the distal phalanx of the left great toe, initial encounter  Fracture confirmed by x-ray, buddy tape to first and second toe with postop shoe , recommended heel walking, orthopedic follow-up in 1 to 2 weeks, over-the-counter ibuprofen for management of pain, urgent care follow-up as needed Final Clinical Impressions(s) / UC Diagnoses   Final diagnoses:  None   Discharge Instructions   None    ED Prescriptions   None    PDMP not reviewed this encounter.   Valinda Hoar, NP 11/03/21 1914

## 2022-01-30 ENCOUNTER — Ambulatory Visit
Admission: RE | Admit: 2022-01-30 | Discharge: 2022-01-30 | Disposition: A | Discharge status: Home / Self Care | Payer: 59 | Source: Ambulatory Visit

## 2022-01-30 VITALS — BP 148/82 | HR 71 | Temp 98.1°F | Resp 18

## 2022-01-30 DIAGNOSIS — J0100 Acute maxillary sinusitis, unspecified: Secondary | ICD-10-CM | POA: Diagnosis not present

## 2022-01-30 MED ORDER — AMOXICILLIN-POT CLAVULANATE 875-125 MG PO TABS: 1.0000 | ORAL_TABLET | Freq: Two times a day (BID) | ORAL | 0 refills | Status: AC

## 2022-01-30 NOTE — Discharge Instructions (Addendum)
Your symptoms today are consistent with a sinus infection, sinus infections are typically caused by viruses meaning we must give the body time to fight off the infection before attempting use of antibiotics ? ?Therefore please begin the following ? ?Take Flonase every morning, this medication is a steroid nasal spray which helps to loosen secretions out of the sinus passageway as well as to reduce the amount of secretions present ? ?Take Mucinex, this medication helps to thin secretions allowing them to drain ? ?Take an antihistamine such as Claritin or Zyrtec, this medication reduces the amount of secretions that the body will produce ? ?If symptoms have not improved after consistent use of the medicine on Monday, February 02, 2022 there will be an antibiotic at the pharmacy for you, begin use of Augmentin twice daily for 10 days ? ?You may follow-up with urgent care as needed for persisting symptoms ?

## 2022-01-30 NOTE — ED Triage Notes (Signed)
Pt presents with sinus pressure, cough, congestion x 4 days.  Cough productive for thick yellow mucous.  Scratchy throat from drainage. Difficulty sleeping.  No fever at home.  ?

## 2022-01-30 NOTE — ED Provider Notes (Signed)
?[facility] ? ? ? ?CSN: [id number] ?Arrival date & time: 01/30/22  1350 ? ? ?  ? ?History   ?Chief Complaint ?Chief Complaint  ?Patient presents with  ? Sore Throat  ?  APPT 2:00   ? Facial Pain  ? ? ?HPI ?JEARL HAVARD is a 31 y.o. female.  ? ?Patient presents with nasal congestion, ear pain, sinus pain and pressure and rhinorrhea for 4 days.  Tolerating food and liquids.  No known sick contacts.  Has attempted use of Sudafed and ibuprofen which has been minimally helpful.  Denies fever, chills, body aches, coughing, shortness of breath, wheezing, headaches. ? ?Past Medical History:  ?Diagnosis Date  ? Depression   ? Endometriosis   ? ? ?There are no problems to display for this patient. ? ? ?Past Surgical History:  ?Procedure Laterality Date  ? EXTERNAL EAR SURGERY    ? ? ?OB History   ?No obstetric history on file. ?  ? ? ? ?Home Medications   ? ?Prior to Admission medications   ?Medication Sig Start Date End Date Taking? Authorizing Provider  ?escitalopram (LEXAPRO) 20 MG tablet Take 20 mg by mouth daily.   Yes [provider]  ?loratadine (CLARITIN) 10 MG tablet Take 10 mg by mouth daily.   Yes [provider]  ?norethindrone-ethinyl estradiol (VYFEMLA) 0.4-35 MG-MCG tablet Take 1 tablet by mouth daily.   Yes [provider]  ? ? ?Family History ?Family History  ?Problem Relation Age of Onset  ? Healthy Mother   ? Healthy Father   ? ? ?Social History ?Social History  ? ?Tobacco Use  ? Smoking status: Never  ? Smokeless tobacco: Never  ?Vaping Use  ? Vaping Use: Never used  ?Substance Use Topics  ? Alcohol use: Yes  ?  Comment: occ  ? Drug use: No  ? ? ? ?Allergies   ?Kenalog [triamcinolone acetonide] ? ? ?Review of Systems ?Review of Systems  ?Constitutional: Negative.   ?HENT:  Positive for congestion, ear pain, sinus pressure, sinus pain and sore throat. Negative for dental problem, drooling, ear discharge, facial swelling, hearing loss, mouth sores, nosebleeds,  postnasal drip, rhinorrhea, sneezing, tinnitus, trouble swallowing and voice change.   ?Respiratory: Negative.    ?Cardiovascular: Negative.   ?Gastrointestinal: Negative.   ?Skin: Negative.   ?Neurological: Negative.   ? ? ?Physical Exam ?Triage Vital Signs ?ED Triage Vitals  ?Enc Vitals Group  ?   BP 01/30/22 1414 (!) 148/82  ?   Pulse Rate 01/30/22 1414 71  ?   Resp 01/30/22 1414 18  ?   Temp 01/30/22 1414 98.1 ?F (36.7 ?C)  ?   Temp Source 01/30/22 1414 Oral  ?   SpO2 01/30/22 1414 100 %  ?   Weight --   ?   Height --   ?   Head Circumference --   ?   Peak Flow --   ?   Pain Score 01/30/22 1415 3  ?   Pain Loc --   ?   Pain Edu? --   ?   Excl. in [place]? --   ? ?No data found. ? ?Updated Vital Signs ?BP (!) 148/82 (BP Location: Left Arm)   Pulse 71   Temp 98.1 ?F (36.7 ?C) (Oral)   Resp 18   SpO2 100%  ? ?Visual Acuity ?Right Eye Distance:   ?Left Eye Distance:   ?Bilateral Distance:   ? ?Right Eye Near:   ?Left Eye Near:    ?  Bilateral Near:    ? ?Physical Exam ?Constitutional:   ?   Appearance: Normal appearance. She is well-developed.  ?HENT:  ?   Head: Normocephalic.  ?   Right Ear: Tympanic membrane and ear canal normal.  ?   Left Ear: Tympanic membrane and ear canal normal.  ?   Nose: Congestion and rhinorrhea present.  ?   Right Sinus: Maxillary sinus tenderness present. No frontal sinus tenderness.  ?   Left Sinus: Maxillary sinus tenderness present. No frontal sinus tenderness.  ?   Mouth/Throat:  ?   Mouth: Mucous membranes are moist.  ?   Pharynx: Posterior oropharyngeal erythema present.  ?Eyes:  ?   Extraocular Movements: Extraocular movements intact.  ?Cardiovascular:  ?   Rate and Rhythm: Normal rate and regular rhythm.  ?   Pulses: Normal pulses.  ?   Heart sounds: Normal heart sounds.  ?Pulmonary:  ?   Effort: Pulmonary effort is normal.  ?   Breath sounds: Normal breath sounds.  ?Musculoskeletal:  ?   Cervical back: Normal range of motion and neck supple.  ?Skin: ?   General: Skin is warm and  dry.  ?Neurological:  ?   General: No focal deficit present.  ?   Mental Status: She is alert and oriented to person, place, and time.  ?Psychiatric:     ?   Mood and Affect: Mood normal.     ?   Behavior: Behavior normal.  ? ? ? ?UC Treatments / Results  ?Labs ?(all labs ordered are listed, but only abnormal results are displayed) ?Labs Reviewed - No data to display ? ?EKG ? ? ?Radiology ?No results found. ? ?Procedures ?Procedures (including critical care time) ? ?Medications Ordered in UC ?Medications - No data to display ? ?Initial Impression / Assessment and Plan / UC Course  ?I have reviewed the triage vital signs and the nursing notes. ? ?Pertinent labs & imaging results that were available during my care of the patient were reviewed by me and considered in my medical decision making (see chart for details). ? ?Acute nonrecurrent maxillary sinusitis ? ?Vital signs are stable, O2 saturation 100% on room air, lungs are clear to auscultation, congestion noted to the nasal turbinates and tenderness seen bilaterally in the maxillary sinuses, discussed findings with patient, recommended use of Mucinex, antihistamine and Flonase for several days while watchful intake antibiotic will be placed at pharmacy for day 8 of illness if no symptoms have improved, may follow-up with urgent care as needed ?Final Clinical Impressions(s) / UC Diagnoses  ? ?Final diagnoses:  ?None  ? ?Discharge Instructions   ?None ?  ? ?ED Prescriptions   ?None ?  ? ?PDMP not reviewed this encounter. ?  ?Hans Eden, NP ?01/30/22 1605 ? ?

## 2022-03-02 DIAGNOSIS — J45909 Unspecified asthma, uncomplicated: Secondary | ICD-10-CM | POA: Insufficient documentation

## 2022-03-02 DIAGNOSIS — F419 Anxiety disorder, unspecified: Secondary | ICD-10-CM | POA: Insufficient documentation

## 2022-03-02 DIAGNOSIS — N809 Endometriosis, unspecified: Secondary | ICD-10-CM | POA: Insufficient documentation

## 2022-10-25 ENCOUNTER — Encounter: Payer: Self-pay | Admitting: Emergency Medicine

## 2022-10-25 ENCOUNTER — Ambulatory Visit
Admission: EM | Admit: 2022-10-25 | Discharge: 2022-10-25 | Disposition: A | Discharge status: Home / Self Care | Payer: 59 | Attending: Emergency Medicine | Admitting: Emergency Medicine

## 2022-10-25 DIAGNOSIS — Z23 Encounter for immunization: Secondary | ICD-10-CM | POA: Diagnosis not present

## 2022-10-25 DIAGNOSIS — W540XXA Bitten by dog, initial encounter: Secondary | ICD-10-CM | POA: Diagnosis not present

## 2022-10-25 DIAGNOSIS — S41131A Puncture wound without foreign body of right upper arm, initial encounter: Secondary | ICD-10-CM

## 2022-10-25 MED ORDER — KETOROLAC TROMETHAMINE 60 MG/2ML IM SOLN
30.0000 mg | Freq: Once | INTRAMUSCULAR | Status: AC
  Administered 2022-10-25: 30 mg via INTRAMUSCULAR

## 2022-10-25 MED ORDER — TETANUS-DIPHTH-ACELL PERTUSSIS 5-2.5-18.5 LF-MCG/0.5 IM SUSY
0.5000 mL | PREFILLED_SYRINGE | Freq: Once | INTRAMUSCULAR | Status: AC
  Administered 2022-10-25: 0.5 mL via INTRAMUSCULAR

## 2022-10-25 MED ORDER — TRAMADOL HCL 50 MG PO TABS: 50.0000 mg | ORAL_TABLET | Freq: Four times a day (QID) | ORAL | 0 refills | Status: DC | PRN

## 2022-10-25 MED ORDER — AMOXICILLIN-POT CLAVULANATE 875-125 MG PO TABS: 1.0000 | ORAL_TABLET | Freq: Two times a day (BID) | ORAL | 0 refills | Status: DC

## 2022-10-25 NOTE — Discharge Instructions (Addendum)
Today you are being treated for dog bite  You have been given a tetanus injection, this is good for 10 years  Dog bites are considered dirty and therefore we do not want to close of the wound as it would troponin drawn , All wounds have been cleansed here in the office thoroughly and antibiotic cream applied  You have been placed on antibiotic prophylactically to keep area from becoming infected, take Augmentin every morning and every evening for 7 days, any point if you begin to see increased swelling, have increased pain or puslike drainage from the site please return for reevaluation  For pain you may use tramadol every 8 hours as needed, be mindful of this will make you drowsy, you may take Tylenol and Motrin in addition to this  As dogs are pets and up-to-date on vaccines you will not need rabies series  Please follow-up with urgent care as needed for reevaluation

## 2022-10-25 NOTE — ED Provider Notes (Addendum)
[facility]    CSN: [id number] Arrival date & time: 10/25/22  1224      History   Chief Complaint Chief Complaint  Patient presents with   Animal Bite    Right arm    HPI Regina Macias is a 32 y.o. female.   Patient presents for evaluation of multiple dog bites to the right arm occurring within the hour.  Offending dog is her pet, got into a scuffle with the other dog within household and while attempting to break it up she was bitten.  Sites are currently bleeding.  Endorses severe pain to the right arm, has full range of motion, endorses tingling to the left fifth finger.  Has not attempted treatment.  Past Medical History:  Diagnosis Date   Depression    Endometriosis     There are no problems to display for this patient.   Past Surgical History:  Procedure Laterality Date   EXTERNAL EAR SURGERY      OB History   No obstetric history on file.      Home Medications    Prior to Admission medications   Medication Sig Start Date End Date Taking? Authorizing Provider  amoxicillin-clavulanate (AUGMENTIN) 875-125 MG tablet Take 1 tablet by mouth every 12 (twelve) hours. 10/25/22  Yes Khalea Ventura R, NP  escitalopram (LEXAPRO) 20 MG tablet Take 20 mg by mouth daily.   Yes [provider]  norethindrone-ethinyl estradiol (VYFEMLA) 0.4-35 MG-MCG tablet Take 1 tablet by mouth daily.   Yes [provider]  traMADol (ULTRAM) 50 MG tablet Take 1 tablet (50 mg total) by mouth every 6 (six) hours as needed. 10/25/22  Yes Aliz Meritt R, NP  loratadine (CLARITIN) 10 MG tablet Take 10 mg by mouth daily.    [provider]    Family History Family History  Problem Relation Age of Onset   Healthy Mother    Healthy Father     Social History Social History   Tobacco Use   Smoking status: Never   Smokeless tobacco: Never  Vaping Use   Vaping Use: Never used  Substance Use Topics   Alcohol use: Yes    Comment: occ   Drug  use: No     Allergies   Kenalog [triamcinolone acetonide]   Review of Systems Review of Systems  Constitutional: Negative.   Respiratory: Negative.    Cardiovascular: Negative.   Skin:  Positive for wound. Negative for color change, pallor and rash.  Neurological: Negative.      Physical Exam Triage Vital Signs ED Triage Vitals  Enc Vitals Group     BP 10/25/22 1237 108/63     Pulse Rate 10/25/22 1237 61     Resp 10/25/22 1237 14     Temp 10/25/22 1237 98.1 F (36.7 C)     Temp Source 10/25/22 1237 Oral     SpO2 10/25/22 1237 95 %     Weight 10/25/22 1236 139 lb 1.8 oz (63.1 kg)     Height 10/25/22 1236 5\' 1"  (1.549 m)     Head Circumference --      Peak Flow --      Pain Score 10/25/22 1235 7     Pain Loc --      Pain Edu? --      Excl. in [place]? --    No data found.  Updated Vital Signs BP 108/63 (BP Location: Left Arm)   Pulse 61   Temp 98.1 F (36.7  C) (Oral)   Resp 14   Ht 5\' 1"  (1.549 m)   Wt 139 lb 1.8 oz (63.1 kg)   SpO2 95%   BMI 26.28 kg/m   Visual Acuity Right Eye Distance:   Left Eye Distance:   Bilateral Distance:    Right Eye Near:   Left Eye Near:    Bilateral Near:     Physical Exam Constitutional:      Appearance: Normal appearance.  Eyes:     Extraocular Movements: Extraocular movements intact.  Pulmonary:     Effort: Pulmonary effort is normal.  Skin:         Comments: 7 puncture wounds present over the right forearm, 1 puncture wound present to the dorsum aspect of the right hand at the base of the right fifth finger  Has full range of motion of right arm, has full range of motion of right hand, Sensation intact, 2+ radial pulse  Neurological:     Mental Status: She is alert and oriented to person, place, and time.      UC Treatments / Results  Labs (all labs ordered are listed, but only abnormal results are displayed) Labs Reviewed - No data to display  EKG   Radiology No results  found.  Procedures Procedures (including critical care time)  Medications Ordered in UC Medications  Tdap (BOOSTRIX) injection 0.5 mL (0.5 mLs Intramuscular Given 10/25/22 1238)  ketorolac (TORADOL) injection 30 mg (30 mg Intramuscular Given 10/25/22 1326)    Initial Impression / Assessment and Plan / UC Course  I have reviewed the triage vital signs and the nursing notes.  Pertinent labs & imaging results that were available during my care of the patient were reviewed by me and considered in my medical decision making (see chart for details).  Puncture wound of multiple sites of right upper extremity, initial encounter Dog bite, initial encounter  Wounds cleansed in office with chlorhexidine and additional wound cleanser, topical Bactroban applied and covered with nonadherent dressing, advised daily cleansing with diluted nonscented soapy water, pat dry and cover with nonadherent dressing until healed, prophylactically prescribed Augmentin, given Toradol injection in the office, prescribed tramadol for outpatient as patient is experiencing severe pain, visibly uncomfortable, given strict precautions for signs of infection to return for reevaluation, tetanus given  Had episode of vasovagal in lobby, stabilized by laying flat, blood pressure stable at 108/63 Final Clinical Impressions(s) / UC Diagnoses   Final diagnoses:  Puncture wound of multiple sites of right upper extremity, initial encounter  Dog bite, initial encounter     Discharge Instructions      Today you are being treated for dog bite  You have been given a tetanus injection, this is good for 10 years  Dog bites are considered dirty and therefore we do not want to close of the wound as it would troponin drawn , All wounds have been cleansed here in the office thoroughly and antibiotic cream applied  You have been placed on antibiotic prophylactically to keep area from becoming infected, take Augmentin every morning  and every evening for 7 days, any point if you begin to see increased swelling, have increased pain or puslike drainage from the site please return for reevaluation  For pain you may use tramadol every 8 hours as needed, be mindful of this will make you drowsy, you may take Tylenol and Motrin in addition to this  As dogs are pets and up-to-date on vaccines you will not need rabies series  Please follow-up with urgent care as needed for reevaluation   ED Prescriptions     Medication Sig Dispense Auth. Provider   amoxicillin-clavulanate (AUGMENTIN) 875-125 MG tablet Take 1 tablet by mouth every 12 (twelve) hours. 14 tablet Thomasenia Dowse R, NP   traMADol (ULTRAM) 50 MG tablet Take 1 tablet (50 mg total) by mouth every 6 (six) hours as needed. 15 tablet Simonne Boulos, Leitha Schuller, NP      I have reviewed the PDMP during this encounter.   Hans Eden, NP 10/25/22 1347    Hans Eden, NP 10/25/22 1348

## 2022-10-25 NOTE — ED Triage Notes (Signed)
Patient states that she tried to break up her dogs and the older dog bit her right arm in several places.  Patient states that the dos are both up to date on vaccines.  Patient states this happened about 30 min ago.  Patient states that she felt lightheaded and was laied down.

## 2022-10-31 ENCOUNTER — Ambulatory Visit
Admission: RE | Admit: 2022-10-31 | Discharge: 2022-10-31 | Disposition: A | Discharge status: Home / Self Care | Payer: 59 | Source: Ambulatory Visit | Attending: Family Medicine | Admitting: Family Medicine

## 2022-10-31 VITALS — BP 130/81 | HR 80 | Temp 98.9°F | Resp 16 | Ht 61.0 in | Wt 142.0 lb

## 2022-10-31 DIAGNOSIS — W540XXD Bitten by dog, subsequent encounter: Secondary | ICD-10-CM | POA: Diagnosis not present

## 2022-10-31 DIAGNOSIS — L03113 Cellulitis of right upper limb: Secondary | ICD-10-CM | POA: Diagnosis not present

## 2022-10-31 MED ORDER — DOXYCYCLINE HYCLATE 100 MG PO CAPS: 100.0000 mg | ORAL_CAPSULE | Freq: Two times a day (BID) | ORAL | 0 refills | Status: DC

## 2022-10-31 NOTE — Discharge Instructions (Addendum)
Stop by the pharmacy to pick up your prescriptions.  Follow up with your orthopedic provider if numbness does not improve

## 2022-10-31 NOTE — ED Provider Notes (Signed)
[facility]    CSN: [id number] Arrival date & time: 10/31/22  1116      History   Chief Complaint Chief Complaint  Patient presents with   Follow-up    I was there on 1/7 for a dog attack on my arm and it is healing, however I am having discolored drainage and numbness in parts of my arm/fingers. - Entered by patient    HPI Regina Macias is a 32 y.o. female.   HPI  Regina Macias presents for wound care follow-up.  States that she is having increased discharge from one of the bites of her right arm.  She has been changing the bandages daily-the only side that has more drainage than the others.  Denies any recent fevers.  Of note, she has been having pinky and forearm paresthesias and numbness.    Past Medical History:  Diagnosis Date   Depression    Endometriosis     There are no problems to display for this patient.   Past Surgical History:  Procedure Laterality Date   EXTERNAL EAR SURGERY      OB History   No obstetric history on file.      Home Medications    Prior to Admission medications   Medication Sig Start Date End Date Taking? Authorizing Provider  doxycycline (VIBRAMYCIN) 100 MG capsule Take 1 capsule (100 mg total) by mouth 2 (two) times daily. 10/31/22  Yes Atarah Cadogan, DO  escitalopram (LEXAPRO) 20 MG tablet Take 20 mg by mouth daily.   Yes [provider]  loratadine (CLARITIN) 10 MG tablet Take 10 mg by mouth daily.   Yes [provider]  norethindrone-ethinyl estradiol (VYFEMLA) 0.4-35 MG-MCG tablet Take 1 tablet by mouth daily.   Yes [provider]  traMADol (ULTRAM) 50 MG tablet Take 1 tablet (50 mg total) by mouth every 6 (six) hours as needed. 10/25/22  Yes White, Leitha Schuller, NP    Family History Family History  Problem Relation Age of Onset   Healthy Mother    Healthy Father     Social History Social History   Tobacco Use   Smoking status: Never   Smokeless tobacco: Never  Vaping Use    Vaping Use: Never used  Substance Use Topics   Alcohol use: Yes    Comment: occ   Drug use: No     Allergies   Kenalog [triamcinolone acetonide]   Review of Systems Review of Systems :negative unless otherwise stated in HPI.      Physical Exam Triage Vital Signs ED Triage Vitals  Enc Vitals Group     BP 10/31/22 1125 130/81     Pulse Rate 10/31/22 1125 80     Resp 10/31/22 1125 16     Temp 10/31/22 1125 98.9 F (37.2 C)     Temp Source 10/31/22 1125 Oral     SpO2 10/31/22 1125 97 %     Weight 10/31/22 1124 142 lb (64.4 kg)     Height 10/31/22 1124 5\' 1"  (1.549 m)     Head Circumference --      Peak Flow --      Pain Score 10/31/22 1123 4     Pain Loc --      Pain Edu? --      Excl. in [place]? --    No data found.  Updated Vital Signs BP 130/81 (BP Location: Left Arm)   Pulse 80   Temp 98.9 F (37.2 C) (Oral)  Resp 16   Ht 5\' 1"  (1.549 m)   Wt 64.4 kg   SpO2 97%   BMI 26.83 kg/m   Visual Acuity Right Eye Distance:   Left Eye Distance:   Bilateral Distance:    Right Eye Near:   Left Eye Near:    Bilateral Near:     Physical Exam  GEN: alert, well appearing female, in no acute distress  EYES: extra occular movements intact, no scleral injection CV: regular rate, radial pulse palpable  RESP: no increased work of breathing MSK: no extremity edema, no gross deformities NEURO: alert, moves all extremities appropriately, gross sensation intact of RUE SKIN: warm and dry; anterior large bite wound with purulent discharge    UC Treatments / Results  Labs (all labs ordered are listed, but only abnormal results are displayed) Labs Reviewed - No data to display  EKG   Radiology No results found.  Procedures Procedures (including critical care time)  Medications Ordered in UC Medications - No data to display  Initial Impression / Assessment and Plan / UC Course  I have reviewed the triage vital signs and the nursing notes.  Pertinent labs &  imaging results that were available during my care of the patient were reviewed by me and considered in my medical decision making (see chart for details).     Patient is a 32 y.o. femalewho presents for bite  wound assessment.  Overall, patient is well-appearing and well-hydrated.  Vital signs stable.  Regina Macias is afebrile.  Exam concerning for cellulitis.  Wound cleaned and dressed with (Xeroform) wet-to-dry sterile dressing. Treat with doxycycline for MRSA coverage as she is completing Augmentin today.  Return, ED and follow up with orthopedic provider discussed and she voiced understanding.   Reviewed expectations regarding course of current medical issues.  All questions asked were answered.  Outlined signs and symptoms indicating need for more acute intervention. Patient verbalized understanding. After Visit Summary given.   Final Clinical Impressions(s) / UC Diagnoses   Final diagnoses:  Cellulitis of right upper extremity  Dog bite, subsequent encounter     Discharge Instructions      Stop by the pharmacy to pick up your prescriptions.  Follow up with your orthopedic provider if numbness does not improve      ED Prescriptions     Medication Sig Dispense Auth. Provider   doxycycline (VIBRAMYCIN) 100 MG capsule Take 1 capsule (100 mg total) by mouth 2 (two) times daily. 20 capsule Lyndee Hensen, DO      PDMP not reviewed this encounter.              Lyndee Hensen, DO 10/31/22 1501

## 2022-10-31 NOTE — ED Triage Notes (Signed)
I was there on 1/7 for a dog attack on my arm and it is healing, however I am having discolored drainage and numbness in parts of my arm/fingers. - Entered by patient

## 2023-04-05 ENCOUNTER — Ambulatory Visit
Admission: RE | Admit: 2023-04-05 | Discharge: 2023-04-05 | Disposition: A | Discharge status: Home / Self Care | Payer: 59 | Source: Ambulatory Visit | Attending: Physician Assistant | Admitting: Physician Assistant

## 2023-04-05 ENCOUNTER — Ambulatory Visit (INDEPENDENT_AMBULATORY_CARE_PROVIDER_SITE_OTHER): Payer: 59

## 2023-04-05 VITALS — BP 125/98 | HR 65 | Temp 98.6°F | Resp 16 | Ht 62.0 in | Wt 145.0 lb

## 2023-04-05 DIAGNOSIS — M79672 Pain in left foot: Secondary | ICD-10-CM

## 2023-04-05 DIAGNOSIS — S93602A Unspecified sprain of left foot, initial encounter: Secondary | ICD-10-CM

## 2023-04-05 NOTE — ED Triage Notes (Signed)
Pt c/o L foot pain x1 day due to fall. States she was trying to put her shoes on & her ankle twisted. Pain mostly in top of foot.

## 2023-04-05 NOTE — Discharge Instructions (Signed)
Recommend wear ace wrap for support and comfort. May apply ice to area for the next 24 to 48 hours to help with swelling and pain. Recommend take OTC Ibuprofen 600mg  every 8 hours or OTC Aleve 2 tablets every 12 hours as needed for pain. Recommend follow-up with [facility] in 5 to 7 days if not improving.

## 2023-04-05 NOTE — ED Provider Notes (Signed)
[facility]    CSN: [id number] Arrival date & time: 04/05/23  1050      History   Chief Complaint Chief Complaint  Patient presents with   Fall   Foot Pain    HPI Regina Macias is a 32 y.o. female.   32 year old female presents with injury to her left foot. Yesterday, She was trying to put on her shoes while standing to take her dog out when she missed placing her foot in her shoe and twisted her left foot/ankle and fell down. She experienced immediate pain and is having some difficulty applying pressure to foot. Last night, she woke up and had difficulty sleeping due to throbbing pain. Today the top of her foot is painful and swollen. She did not apply any ice to the area but took Ibuprofen this morning with some relief. She broke her left great toe last year and is concerned over possible fracture today. She denies any numbness. She still can bear weight with some pain. No other current chronic health issues. Takes oral contraceptives, Lexapro and Claritin daily.   The history is provided by the patient.    Past Medical History:  Diagnosis Date   Depression    Endometriosis     There are no problems to display for this patient.   Past Surgical History:  Procedure Laterality Date   EXTERNAL EAR SURGERY      OB History   No obstetric history on file.      Home Medications    Prior to Admission medications   Medication Sig Start Date End Date Taking? Authorizing Provider  escitalopram (LEXAPRO) 20 MG tablet Take 20 mg by mouth daily.   Yes [provider]  loratadine (CLARITIN) 10 MG tablet Take 10 mg by mouth daily.   Yes [provider]  norethindrone-ethinyl estradiol (VYFEMLA) 0.4-35 MG-MCG tablet Take 1 tablet by mouth daily.   Yes [provider]    Family History Family History  Problem Relation Age of Onset   Healthy Mother    Healthy Father     Social History Social History   Tobacco Use   Smoking  status: Never   Smokeless tobacco: Never  Vaping Use   Vaping Use: Never used  Substance Use Topics   Alcohol use: Yes    Comment: occ   Drug use: No     Allergies   Kenalog [triamcinolone acetonide] and Triamcinolone   Review of Systems Review of Systems  Constitutional:  Negative for appetite change, chills, diaphoresis, fatigue and fever.  Musculoskeletal:  Positive for arthralgias, joint swelling and myalgias.  Skin:  Negative for color change, rash and wound.  Allergic/Immunologic: Positive for environmental allergies. Negative for food allergies and immunocompromised state.  Neurological:  Negative for dizziness, tremors, seizures, syncope, weakness, light-headedness, numbness and headaches.  Hematological:  Negative for adenopathy. Does not bruise/bleed easily.     Physical Exam Triage Vital Signs ED Triage Vitals  Enc Vitals Group     BP 04/05/23 1056 (!) 125/98     Pulse Rate 04/05/23 1056 65     Resp 04/05/23 1056 16     Temp 04/05/23 1056 98.6 F (37 C)     Temp Source 04/05/23 1056 Oral     SpO2 04/05/23 1056 98 %     Weight 04/05/23 1054 145 lb (65.8 kg)     Height 04/05/23 1054 5\' 2"  (1.575 m)     Head Circumference --  Peak Flow --      Pain Score 04/05/23 1058 7     Pain Loc --      Pain Edu? --      Excl. in [place]? --    No data found.  Updated Vital Signs BP (!) 125/98 (BP Location: Left Arm)   Pulse 65   Temp 98.6 F (37 C) (Oral)   Resp 16   Ht 5\' 2"  (1.575 m)   Wt 145 lb (65.8 kg)   SpO2 98%   BMI 26.52 kg/m   Visual Acuity Right Eye Distance:   Left Eye Distance:   Bilateral Distance:    Right Eye Near:   Left Eye Near:    Bilateral Near:     Physical Exam Vitals and nursing note reviewed.  Constitutional:      General: She is awake. She is not in acute distress.    Appearance: She is well-developed and well-groomed.     Comments: She is sitting in the exam chair in no acute distress with her left foot elevated on a  stool.   HENT:     Head: Normocephalic and atraumatic.     Right Ear: Hearing normal.     Left Ear: Hearing normal.  Eyes:     Extraocular Movements: Extraocular movements intact.     Conjunctiva/sclera: Conjunctivae normal.  Cardiovascular:     Rate and Rhythm: Normal rate.     Pulses:          Dorsalis pedis pulses are 2+ on the right side and 2+ on the left side.       Posterior tibial pulses are 2+ on the right side and 2+ on the left side.  Pulmonary:     Effort: Pulmonary effort is normal.  Musculoskeletal:        General: Swelling and tenderness present. Normal range of motion.     Right foot: Normal. Normal range of motion and normal capillary refill. No tenderness. Normal pulse.     Left foot: Normal range of motion and normal capillary refill. Swelling, tenderness and bony tenderness present. No laceration. Normal pulse.       Feet:     Comments: Slight swelling and tenderness along dorsal aspect of left foot near base of tarsal bones. No bruising. No rash or redness. Tender along mid metatarsal bones. Does have full range of motion but with pain, especially with flexion. Full range of motion of ankle with no pain. Good pulses and capillary refill. No neuro deficits noted.   Feet:     Right foot:     Skin integrity: Skin integrity normal.     Left foot:     Skin integrity: Skin integrity normal.  Skin:    General: Skin is warm and dry.     Capillary Refill: Capillary refill takes less than 2 seconds.     Findings: No abrasion, bruising, ecchymosis, erythema, laceration, rash or wound.  Neurological:     General: No focal deficit present.     Mental Status: She is alert and oriented to person, place, and time.     Sensory: Sensation is intact. No sensory deficit.     Motor: Motor function is intact.     Gait: Gait is intact.  Psychiatric:        Mood and Affect: Mood normal.        Behavior: Behavior normal. Behavior is cooperative.        Thought Content: Thought  content normal.  Judgment: Judgment normal.      UC Treatments / Results  Labs (all labs ordered are listed, but only abnormal results are displayed) Labs Reviewed - No data to display  EKG   Radiology DG Foot Complete Left  Result Date: 04/05/2023 CLINICAL DATA:  Pain EXAM: LEFT FOOT - COMPLETE 3+ VIEW COMPARISON:  04/22/2010 FINDINGS: There is no evidence of fracture or dislocation. There is no evidence of arthropathy or other focal bone abnormality. Soft tissues are unremarkable. IMPRESSION: No acute findings are seen in left foot. Electronically Signed   By: Ernie Avena M.D.   On: 04/05/2023 12:06    Procedures Procedures (including critical care time)  Medications Ordered in UC Medications - No data to display  Initial Impression / Assessment and Plan / UC Course  I have reviewed the triage vital signs and the nursing notes.  Pertinent labs & imaging results that were available during my care of the patient were reviewed by me and considered in my medical decision making (see chart for details).     Reviewed x-ray results with patient- no distinct fracture or dislocation seen. Discussed that she probably sprained her foot. Recommend wear ace wrap for support. May apply ice to area for the next 24 to 48 hours to help with swelling and pain. May take OTC Ibuprofen 600mg  every 8 hours or OTC Aleve 2 tablets every 12 hours as needed for pain. Patient has seen [facility] in the past for other Orthopedic issues. She will follow-up with [facility] in 5 to 7 days if not improving.   Final Clinical Impressions(s) / UC Diagnoses   Final diagnoses:  Acute foot pain, left  Sprain of left foot, initial encounter     Discharge Instructions      Recommend wear ace wrap for support and comfort. May apply ice to area for the next 24 to 48 hours to help with swelling and pain. Recommend take OTC Ibuprofen 600mg  every 8 hours or OTC Aleve 2 tablets every 12 hours as  needed for pain. Recommend follow-up with [facility] in 5 to 7 days if not improving.      ED Prescriptions   None    PDMP not reviewed this encounter.   Sudie Grumbling, NP 04/06/23 1056

## 2024-01-01 IMAGING — CR DG TOE GREAT 2+V*L*
3 series · 3 of 3 positions shown · non-contrast
Comparison: 04/22/2010

CLINICAL DATA: Injury at the gym.  Weight fell on great toe.

EXAM:
LEFT GREAT TOE

[toe ap]
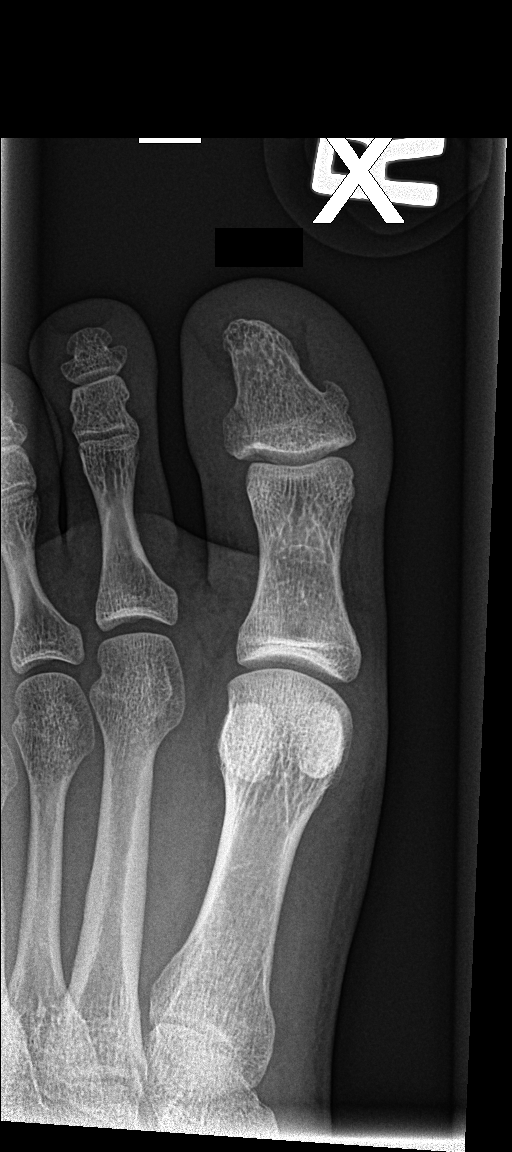

[toe obl]
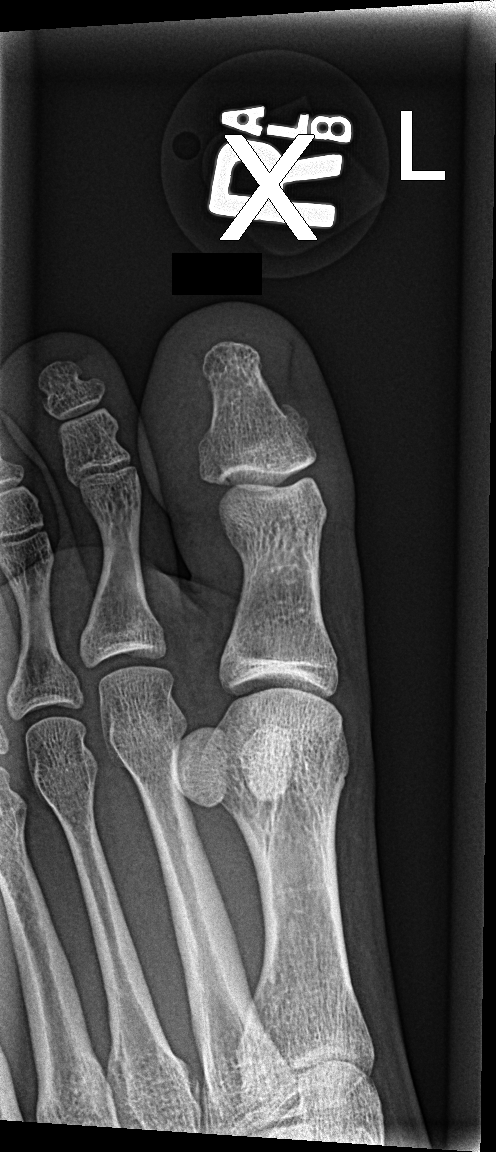

[toe lat]
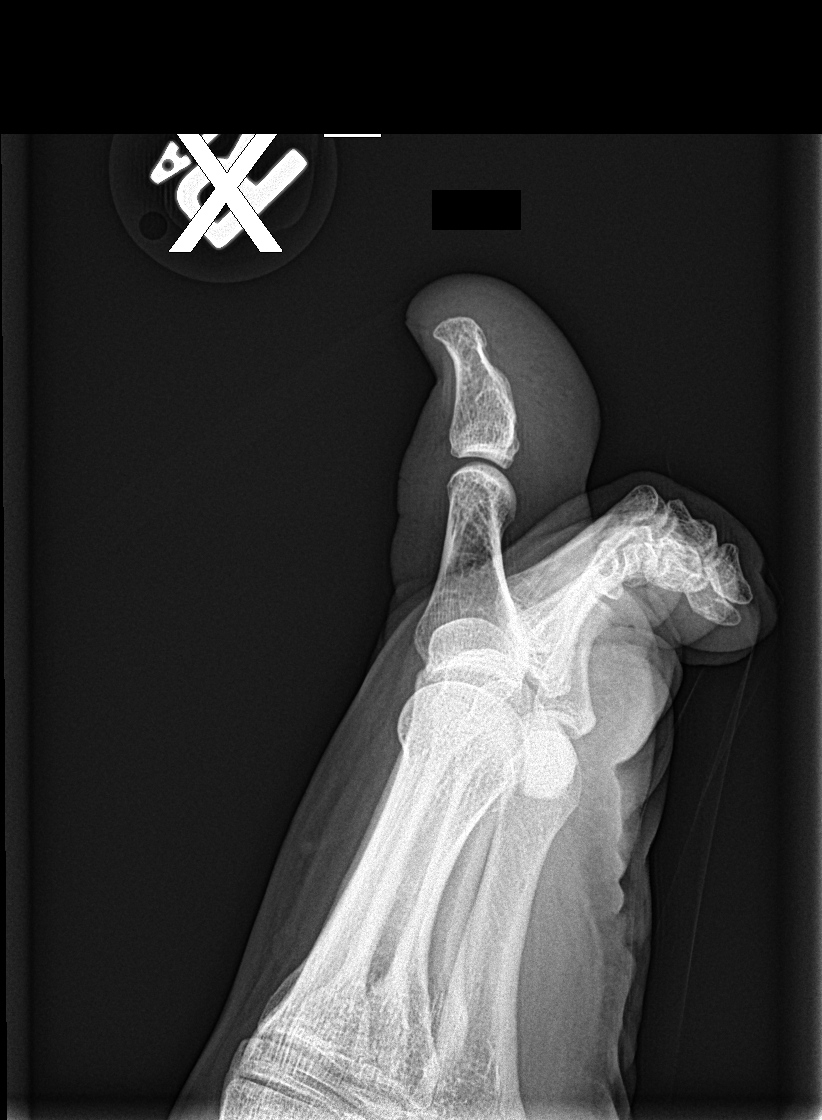

[3 of 3 positions shown; findings below may reference images not displayed]

FINDINGS: There is an acute minimally displaced fracture involving the LATERAL
aspect of the distal phalanx of the great toe. Fracture line extends
to the articular surface. There is associated soft tissue swelling.
No radiopaque foreign body.
IMPRESSION: Fracture of the distal phalanx of the great toe.

## 2024-02-17 ENCOUNTER — Encounter: Payer: Self-pay | Admitting: Family Medicine

## 2024-02-17 ENCOUNTER — Ambulatory Visit: Admitting: Family Medicine

## 2024-02-17 VITALS — BP 121/86 | HR 68 | Temp 98.1°F | Resp 16 | Ht 61.0 in | Wt 142.6 lb

## 2024-02-17 DIAGNOSIS — N644 Mastodynia: Secondary | ICD-10-CM

## 2024-02-17 DIAGNOSIS — N6320 Unspecified lump in the left breast, unspecified quadrant: Secondary | ICD-10-CM

## 2024-02-17 DIAGNOSIS — R030 Elevated blood-pressure reading, without diagnosis of hypertension: Secondary | ICD-10-CM | POA: Diagnosis not present

## 2024-02-17 DIAGNOSIS — N6310 Unspecified lump in the right breast, unspecified quadrant: Secondary | ICD-10-CM | POA: Diagnosis not present

## 2024-02-17 DIAGNOSIS — Z7689 Persons encountering health services in other specified circumstances: Secondary | ICD-10-CM

## 2024-02-17 DIAGNOSIS — F330 Major depressive disorder, recurrent, mild: Secondary | ICD-10-CM | POA: Diagnosis not present

## 2024-02-17 NOTE — Patient Instructions (Signed)
 [email or web address]    MyChart:  For all urgent or time sensitive needs we ask that you please call the office to avoid delays. Our number is [phone]) [phone]. MyChart is not constantly monitored and due to the large volume of messages a day, replies may take up to 72 business hours.   MyChart Policy: MyChart allows for you to see your visit notes, after visit summary, provider recommendations, lab and tests results, make an appointment, request refills, and contact your provider or the office for non-urgent questions or concerns. Providers are seeing patients during normal business hours and do not have built in time to review MyChart messages.  We ask that you allow a minimum of 3 business days for responses to [employer]. For this reason, please do not send urgent requests through MyChart. Please call the office at [phone]. New and ongoing conditions may require a visit. We have virtual and in person visit available for your convenience.  Complex MyChart concerns may require a visit. Your provider may request you schedule a virtual or in person visit to ensure we are providing the best care possible. MyChart messages sent after 11:00 AM on Friday will not be received by the provider until Monday morning.    Lab and Test Results: You will receive your lab and test results on MyChart as soon as they are completed and results have been sent by the lab or testing facility. Due to this service, you will receive your results BEFORE your provider.  I review lab and tests results each morning prior to seeing patients. Some results require collaboration with other providers to ensure you are receiving the most appropriate care. For this reason, we ask that you please allow a minimum of 3-5 business days from the time the ALL results have been received for your provider to receive and review lab and test results and contact you about these.  Most lab and test result comments from  the provider will be sent through MyChart. Your provider may recommend changes to the plan of care, follow-up visits, repeat testing, ask questions, or request an office visit to discuss these results. You may reply directly to this message or call the office at [phone] to provide information for the provider or set up an appointment. In some instances, you will be called with test results and recommendations. Please let us  know if this is preferred and we will make note of this in your chart to provide this for you.    If you have not heard a response to your lab or test results in 5 business days from all results returning to MyChart, please call the office to let us  know. We ask that you please avoid calling prior to this time unless there is an emergent concern. Due to high call volumes, this can delay the resulting process.   After Hours: For all non-emergency after hours needs, please call the office at [phone] and select the option to reach the on-call provider service. On-call services are shared between multiple [place] offices and therefore it will not be possible to speak directly with your provider. On-call providers may provide medical advice and recommendations, but are unable to provide refills for maintenance medications.  For all emergency or urgent medical needs after normal business hours, we recommend that you seek care at the closest Urgent Care or Emergency Department to ensure appropriate treatment in a timely manner.  [facility] has a 24 hour emergency room located on the  ground floor for your convenience.    Urgent Concerns During the Business Day Providers are seeing patients from 8AM to 5PM, Monday through Thursday, and 8AM to 12PM on Friday with a busy schedule and are most often not able to respond to non-urgent calls until the end of the day or the next business day. If you should have URGENT concerns during the day, please call and speak  to the nurse or schedule a same day appointment so that we can address your concern without delay.    Thank you, again, for choosing me as your health care partner. I appreciate your trust and look forward to learning more about you.    Wilhelmena Hanson, FNP-C

## 2024-02-17 NOTE — Progress Notes (Signed)
 New Patient Office Visit  Subjective   Patient ID: Regina Macias, female    DOB: July 16, 1991  Age: 33 y.o. MRN: [id number]  CC:  Chief Complaint  Patient presents with   Establish Care    Pt. Here to establish care.    Hypertension    Pt. Stated of b/p being elevated. Pt. Stated she notice the b/p being elevated when she visit the doctor. Pt. Stated of not checking her b/p when at home.    Breast Pain    Pt. C/o Rt. Side breast lump with some discomfort that's been going on for 5-6 years.     HPI Regina Macias is a 33 year old female who presents to establish with [facility] at [facility].   CC: Patient here to establish care  Last PCP: none Specialists: [facility]   ELEVATED BP: She presents today with concerns about her blood pressure being elevated at the office.  Patient is not prescribed any medications for HTN and has not been diagnosed with HTN. She has noticed her SBP has been in the 140s.  Patient is not regularly keeping a check on BP at home.  Adhering to low sodium diet: yes Exercising Regularly: yes, 4x per week. Cardio & weight lifting  Denies headache, dizziness, CP, SHOB, vision changes.    BP Readings from Last 3 Encounters:  02/17/24 121/86  04/05/23 (!) 125/98  10/31/22 130/81   BREAST PAIN: Family history of breast cancer in mom and paternal GM  R breast- has noticed a lump starting to be more painful again  Previously sensitive went away Denies skin changes, erythema,  Tenderness of palpation  Last mammogram in 2020- benign mass  Has not had menstrual cycle in 5 years   Outpatient Encounter Medications as of 02/17/2024  Medication Sig   escitalopram (LEXAPRO) 20 MG tablet Take 20 mg by mouth daily.   loratadine (CLARITIN) 10 MG tablet Take 10 mg by mouth daily.   norethindrone-ethinyl estradiol (VYFEMLA) 0.4-35 MG-MCG tablet Take 1 tablet by mouth daily.   No facility-administered encounter medications on file as of 02/17/2024.    DEPRESSION: "have never been feeling better" Taking Lexapro 20mg - has been on since age 62  Does like her dose- psychiatry      02/17/2024    1:38 PM  PHQ9 SCORE ONLY  PHQ-9 Total Score 3      02/17/2024    1:38 PM  GAD 7 : Generalized Anxiety Score  Nervous, Anxious, on Edge 0  Control/stop worrying 0  Worry too much - different things 1  Trouble relaxing 1  Restless 0  Easily annoyed or irritable 1  Afraid - awful might happen 0  Total GAD 7 Score 3  Anxiety Difficulty Not difficult at all    Patient Active Problem List   Diagnosis Date Noted   Active asthma 03/02/2022   Anxiety 03/02/2022   Endometriosis 03/02/2022   Wrist strain, left, initial encounter 12/25/2019   Annual physical exam 11/29/2017   Dysmenorrhea 11/29/2017   Mild intermittent asthma with acute exacerbation 10/08/2015   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   Wrist pain 04/09/2014   Past Medical History:  Diagnosis Date   Depression    Endometriosis    Past Surgical History:  Procedure Laterality Date   EXTERNAL EAR SURGERY     Family History  Problem Relation Age of Onset   Healthy Mother    Breast cancer Mother  lumpectomy, around age 66s   Healthy Father    Breast cancer Paternal Grandmother    Social History   Socioeconomic History   Marital status: Single    Spouse name: Not on file   Number of children: Not on file   Years of education: Not on file   Highest education level: Not on file  Occupational History   Not on file  Tobacco Use   Smoking status: Never   Smokeless tobacco: Never  Vaping Use   Vaping status: Never Used  Substance and Sexual Activity   Alcohol use: Yes    Comment: occ   Drug use: No   Sexual activity: Yes    Birth control/protection: Pill  Other Topics Concern   Not on file  Social History Narrative   Not on file   Social Drivers of Health   Financial Resource Strain: Low Risk  (07/30/2023)   Received from [facility]   Overall Financial Resource Strain (CARDIA)    Difficulty of Paying Living Expenses: Not very hard  Food Insecurity: No Food Insecurity (07/30/2023)   [facility]   Hunger Vital Sign    Worried About Running Out of Food in the Last Year: Never true    Ran Out of Food in the Last Year: Never true  Transportation Needs: No Transportation Needs (07/30/2023)   Received from [facility]    In the past 12 months, has lack of transportation kept you from medical appointments or from getting medications?: No    Lack of Transportation (Non-Medical): No  Physical Activity: Not on file  Stress: Not on file  Social Connections: Not on file  Intimate Partner Violence: Not on file   Outpatient Medications Prior to Visit  Medication Sig Dispense Refill   escitalopram (LEXAPRO) 20 MG tablet Take 20 mg by mouth daily.     loratadine (CLARITIN) 10 MG tablet Take 10 mg by mouth daily.     norethindrone-ethinyl estradiol (VYFEMLA) 0.4-35 MG-MCG tablet Take 1 tablet by mouth daily.     No facility-administered medications prior to visit.   Allergies  Allergen Reactions   Kenalog  [Triamcinolone  Acetonide] Rash   Triamcinolone  Rash   ROS: see HPI    Objective   Today's Vitals   02/17/24 1350 02/17/24 1410  BP: 131/89 121/86  Pulse: 68   Resp: 16   Temp: 98.1 F (36.7 C)   TempSrc: Oral   SpO2: 98%   Weight: 142 lb 9.6 oz (64.7 kg)   Height: 5\' 1"  (1.549 m)   PainSc: 0-No pain     GENERAL: Well-appearing, in NAD. Well nourished.  SKIN: Pink, warm and dry. No rash, lesion, ulceration, or ecchymoses.  Head: Normocephalic. NECK: Trachea midline. Full ROM w/o pain or tenderness. No lymphadenopathy.  EARS: Tympanic membranes are intact, translucent without bulging and without drainage. Appropriate landmarks visualized.  EYES: Conjunctiva clear without exudates. EOMI, PERRL, no drainage  present.  NOSE: Septum midline w/o deformity. Nares patent, mucosa pink and non-inflamed w/o drainage. No sinus tenderness.  THROAT: Uvula midline. Oropharynx clear. Tonsils non-inflamed without exudate. Mucous membranes pink and moist.  RESPIRATORY: Chest wall symmetrical. Respirations even and non-labored. Breath sounds clear to auscultation bilaterally.  CARDIAC: S1, S2 present, regular rate and rhythm without murmur or gallops. Peripheral pulses 2+ bilaterally.  BREAST: Breasts pendulous, symmetrical, and w/o palpable masses. Nipples everted and w/o discharge. No rash or skin retraction. No axillary  or supraclavicular lymphadenopathy.  MSK: Muscle tone and strength appropriate for age. Joints w/o tenderness, redness, or swelling.  EXTREMITIES: Without clubbing, cyanosis, or edema.  NEUROLOGIC: No motor or sensory deficits. Steady, even gait. C2-C12 intact.  PSYCH/MENTAL STATUS: Alert, oriented x 3. Cooperative, appropriate mood and affect.     Assessment & Plan:   1. Encounter to establish care (Primary) Patient is a 1- year-old female who presents today to establish care with primary care at [facility]. Reviewed the past medical history, family history, social history, surgical history, medications and allergies today- updates made as indicated. Patient has concerns today about elevated blood pressure and right breast pain/lump.    2. Elevated blood pressure reading without diagnosis of hypertension Patient presents today with slightly elevated blood pressure, repeat blood pressure improved. Patient in no acute distress and is well-appearing. Denies chest pain, shortness of breath, lower extremity edema, vision changes, headaches. Cardiovascular exam with heart regular rate and rhythm. Normal heart sounds, no murmurs present. No lower extremity edema present. Lungs clear to auscultation bilaterally. Patient does not have a diagnosis of hypertension. Advised patient to check blood pressure  reading at home. Discussed low sodium diet, daily exercise and stress management. Return to office sooner if blood pressure begins to increase greater than 130/80.   3. Painful lumpy right breast Patient presents today with concerns of right breast lump with tenderness to palpation. Denies erythema of the skin, changes to skin texture, nipple inversion, nipple discharge, and asymmetry. Breast exam performed with chaperone, Merwyn Achilles, CMA, present. Physical exam without skin changes, nipple discharge, or asymmetry. Right breast tenderness and lump noted in the 7 o'clock position. Left breast with tenderness and lump present in the 2 o'clock position. No enlarged axillary lymph nodes palpated. Advised patient to perform breast self-examination monthly during menstrual cycle. Will obtain bilateral breast ultrasound- order placed.  - US  BREAST COMPLETE UNI RIGHT INC AXILLA; Future  4. Mild episode of recurrent major depressive disorder (HCC) GAD7 completed with score of 3. Denies issues with panic attacks, shortness of breath, difficulty breathing, palpitations, hyperventilation, and dizziness. PHQ9 completed with score of 3. Denies active or passive suicidal ideations. Currently taking Lexapro 20mg  daily and reports her mood is well controlled. Followed by psychiatry and has follow-ups about every 6 months. No refills needed at this time.   5. Painful lumpy left breast Breast exam performed with chaperone, Merwyn Achilles, CMA, present. Physical exam without skin changes, nipple discharge, or asymmetry. Left breast with tenderness and lump present in the 2 o'clock position. No enlarged axillary lymph nodes palpated. Ultrasound order placed. Will obtain mammogram if recommended.  See #3 - US  BREAST COMPLETE UNI LEFT INC AXILLA; Future  Return in about 6 weeks (around 03/30/2024) for Physical.   Wilhelmena Hanson, FNP

## 2024-03-24 ENCOUNTER — Ambulatory Visit
Admission: RE | Admit: 2024-03-24 | Discharge: 2024-03-24 | Disposition: A | Discharge status: Home / Self Care | Source: Ambulatory Visit | Attending: Physician Assistant | Admitting: Physician Assistant

## 2024-03-24 ENCOUNTER — Ambulatory Visit (INDEPENDENT_AMBULATORY_CARE_PROVIDER_SITE_OTHER)

## 2024-03-24 VITALS — BP 152/93 | HR 79 | Temp 98.6°F | Resp 14 | Ht 61.0 in | Wt 142.0 lb

## 2024-03-24 DIAGNOSIS — M25472 Effusion, left ankle: Secondary | ICD-10-CM | POA: Diagnosis not present

## 2024-03-24 DIAGNOSIS — S80212A Abrasion, left knee, initial encounter: Secondary | ICD-10-CM | POA: Diagnosis not present

## 2024-03-24 DIAGNOSIS — L089 Local infection of the skin and subcutaneous tissue, unspecified: Secondary | ICD-10-CM

## 2024-03-24 DIAGNOSIS — S8992XA Unspecified injury of left lower leg, initial encounter: Secondary | ICD-10-CM

## 2024-03-24 DIAGNOSIS — R609 Edema, unspecified: Secondary | ICD-10-CM

## 2024-03-24 MED ORDER — MUPIROCIN 2 % EX OINT: 1.0000 | TOPICAL_OINTMENT | Freq: Two times a day (BID) | CUTANEOUS | 0 refills | Status: DC

## 2024-03-24 MED ORDER — CEPHALEXIN 500 MG PO CAPS
1000.0000 mg | ORAL_CAPSULE | Freq: Two times a day (BID) | ORAL | 0 refills | Status: AC
Start: 2024-03-24 — End: 2024-03-31

## 2024-03-24 NOTE — Discharge Instructions (Signed)
-  Clean area daily with soap and water, apply ointment and cover with bandage -Start antibiotics -Consider ACE wrap or knee sleeve. -Ice and elevate knee -Consider ortho follow up if knee pain is not improving

## 2024-03-24 NOTE — ED Triage Notes (Signed)
 Patient states that she fell on Saturday and injured her her left knee. Patient reports an abrasion to her left knee.  Patient reports swelling in her left ankle 2 days ago.  Patient denies any pain in her left ankle.

## 2024-03-24 NOTE — ED Provider Notes (Signed)
 [facility]    CSN: [id number] Arrival date & time: 03/24/24  1709      History   Chief Complaint Chief Complaint  Patient presents with   Knee Injury    Appointment   Fall    HPI Regina Macias is a 33 y.o. female presenting for left knee pain, swelling and abrasion that she sustained about 1 week ago when she fell onto concrete while chasing after [place] train in [place].  Patient reports moderate pain at this time and rates it 6 out of 10.  She has been bandaging her abrasion.  There is surrounding redness to the abrasion sites.  She reports full extension and slightly reduced flexion of the knee.  Able to bear weight but it does hurt somewhat to do so.  She became concerned today because she noted swelling in her left ankle.  She has not twisted her ankle and denies any associated pain or change in range of motion of the ankle.  No wounds to ankle.  No other injury sustained in the fall.  Has been cleaning the knee and keeping it bandaged.  Taking OTC meds as needed for pain.  No other complaints.  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      Home Medications    Prior to Admission medications   Medication Sig Start Date End Date Taking? Authorizing Provider  cephALEXin  (KEFLEX ) 500 MG capsule Take 2 capsules (1,000 mg total) by mouth 2 (two) times daily for 7 days. 03/24/24 03/31/24 Yes Nancy Axon B, PA-C   escitalopram (LEXAPRO) 20 MG tablet Take 20 mg by mouth daily.   Yes [provider]  mupirocin ointment (BACTROBAN) 2 % Apply 1 Application topically 2 (two) times daily. 03/24/24  Yes Floydene Hy, PA-C  norethindrone-ethinyl estradiol (VYFEMLA) 0.4-35 MG-MCG tablet Take 1 tablet by mouth daily.   Yes [provider]  loratadine (CLARITIN) 10 MG tablet Take 10 mg by mouth daily.    [provider]    Family History Family History  Problem Relation Age of Onset   Healthy Mother    Breast cancer Mother        lumpectomy, around age 28s   Healthy Father    Breast cancer Paternal Grandmother     Social History Social History   Tobacco Use   Smoking status: Never   Smokeless tobacco: Never  Vaping Use   Vaping status: Never Used  Substance Use Topics   Alcohol use: Yes    Comment: occ   Drug use: No     Allergies   Kenalog  [triamcinolone  acetonide] and Triamcinolone    Review of Systems Review of Systems  Constitutional:  Negative for fatigue and fever.  Musculoskeletal:  Positive for arthralgias and joint swelling. Negative for gait problem.  Skin:  Positive for color change and wound.  Neurological:  Negative for weakness and numbness.  Physical Exam Triage Vital Signs ED Triage Vitals  Encounter Vitals Group     BP 03/24/24 1722 (!) 152/93     Systolic BP Percentile --      Diastolic BP Percentile --      Pulse Rate 03/24/24 1722 79     Resp 03/24/24 1722 14     Temp 03/24/24 1722 98.6 F (37 C)     Temp Source 03/24/24 1722 Oral     SpO2 03/24/24 1722 100 %     Weight 03/24/24 1721 142 lb (64.4 kg)     Height 03/24/24 1721 5\' 1"  (1.549 m)     Head Circumference --      Peak Flow --      Pain Score 03/24/24 1720 6     Pain Loc --      Pain Education --      Exclude from Growth Chart --    No data found.  Updated Vital Signs BP (!) 152/93 (BP Location: Left Arm)   Pulse 79   Temp 98.6 F (37 C) (Oral)   Resp 14    Ht 5\' 1"  (1.549 m)   Wt 142 lb (64.4 kg)   SpO2 100%   BMI 26.83 kg/m       Physical Exam Vitals and nursing note reviewed.  Constitutional:      General: She is not in acute distress.    Appearance: Normal appearance. She is not ill-appearing or toxic-appearing.  HENT:     Head: Normocephalic and atraumatic.  Eyes:     General: No scleral icterus.       Right eye: No discharge.        Left eye: No discharge.     Conjunctiva/sclera: Conjunctivae normal.  Cardiovascular:     Rate and Rhythm: Normal rate.     Pulses: Normal pulses.  Pulmonary:     Effort: Pulmonary effort is normal. No respiratory distress.  Musculoskeletal:     Cervical back: Neck supple.     Comments: LEFT KNEE: There are 2 abrasions of the anterior knee and both have surrounding erythema and tenderness. Diffusely tender knee. Slightly reduced ROM with flexion, but full extension.   LEFT ANKLE: Mild swelling compared to right ankle. No tenderness, wounds, contusions. Full ROM of ankle. No apparent calf swelling or tenderness.  Skin:    General: Skin is dry.  Neurological:     General: No focal deficit present.     Mental Status: She is alert. Mental status is at baseline.     Motor: No weakness.     Gait: Gait normal.  Psychiatric:        Mood and Affect: Mood normal.        Behavior: Behavior normal.      UC Treatments / Results  Labs (all labs ordered are listed, but only abnormal results are displayed) Labs Reviewed - No data to display  EKG   Radiology DG Knee Complete 4 Views Left Result Date: 03/24/2024 CLINICAL DATA:  abrasion and swelling after fall on knee last week. EXAM: LEFT KNEE - COMPLETE 4+ VIEW COMPARISON:  None Available. FINDINGS: No acute fracture or dislocation. No aggressive osseous lesion. The knee joint appears within normal limits. No significant arthritis. No knee effusion or focal soft tissue swelling. No radiopaque foreign bodies. IMPRESSION: *No acute osseous  abnormality of the left knee joint. Electronically Signed   By: Beula Brunswick M.D.   On: 03/24/2024 18:02    Procedures Procedures (  including critical care time)  Medications Ordered in UC Medications - No data to display  Initial Impression / Assessment and Plan / UC Course  I have reviewed the triage vital signs and the nursing notes.  Pertinent labs & imaging results that were available during my care of the patient were reviewed by me and considered in my medical decision making (see chart for details).   33 year old female presents for left knee pain, abrasion, swelling and redness after fall that occurred 6 days ago.  Patient notes swelling to her left ankle just started.  No pain of her ankle.  She is concerned about possible infection.  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  See image included in chart.  Patient has 2 abrasions of the left anterior knee and diffuse tenderness to palpation of the left anterior knee. Slightly reduced flexion. Mild swelling left ankle compared to right. No tenderness of ankle and full ROM of ankle.   X-ray of left knee obtained. Negative.  Reviewed results with patient.   Patient declined knee brace.   Acute injury or right knee with mildly infected abrasions and dependent edema of ankle. Treating at this time with Keflex , topical mupirocin ointment. Reviewed RICE guidelines and wound care. Reviewed follow up with ortho if knee pain is not improving and return here if infection not improving.    Final Clinical Impressions(s) / UC Diagnoses   Final diagnoses:  Injury of left knee, initial encounter  Infected abrasion of left knee, initial encounter  Left ankle swelling  Dependent edema     Discharge Instructions      -Clean area daily with soap and water, apply ointment and cover with bandage -Start antibiotics -Consider ACE wrap or knee sleeve. -Ice and elevate knee -Consider ortho follow up if knee pain is  not improving   ED Prescriptions     Medication Sig Dispense Auth. Provider   cephALEXin  (KEFLEX ) 500 MG capsule Take 2 capsules (1,000 mg total) by mouth 2 (two) times daily for 7 days. 28 capsule Nancy Axon B, PA-C   mupirocin ointment (BACTROBAN) 2 % Apply 1 Application topically 2 (two) times daily. 22 g Floydene Hy, PA-C      PDMP not reviewed this encounter.   Floydene Hy, PA-C 03/24/24 [phone]

## 2024-04-05 ENCOUNTER — Encounter: Payer: Self-pay | Admitting: Family Medicine

## 2024-04-05 ENCOUNTER — Other Ambulatory Visit: Payer: Self-pay | Admitting: Family Medicine

## 2024-04-05 ENCOUNTER — Ambulatory Visit (INDEPENDENT_AMBULATORY_CARE_PROVIDER_SITE_OTHER): Admitting: Family Medicine

## 2024-04-05 ENCOUNTER — Other Ambulatory Visit: Payer: Self-pay

## 2024-04-05 VITALS — BP 124/87 | HR 63 | Temp 97.8°F | Ht 61.0 in | Wt 145.0 lb

## 2024-04-05 DIAGNOSIS — Z23 Encounter for immunization: Secondary | ICD-10-CM

## 2024-04-05 DIAGNOSIS — Z0000 Encounter for general adult medical examination without abnormal findings: Secondary | ICD-10-CM | POA: Diagnosis not present

## 2024-04-05 DIAGNOSIS — E538 Deficiency of other specified B group vitamins: Secondary | ICD-10-CM

## 2024-04-05 DIAGNOSIS — N6320 Unspecified lump in the left breast, unspecified quadrant: Secondary | ICD-10-CM

## 2024-04-05 DIAGNOSIS — N644 Mastodynia: Secondary | ICD-10-CM

## 2024-04-05 DIAGNOSIS — Z1231 Encounter for screening mammogram for malignant neoplasm of breast: Secondary | ICD-10-CM

## 2024-04-05 DIAGNOSIS — N6310 Unspecified lump in the right breast, unspecified quadrant: Secondary | ICD-10-CM

## 2024-04-05 LAB — PAP IG AND HPV HIGH-RISK

## 2024-04-05 NOTE — Patient Instructions (Signed)
 Health Maintenance Recommendations Screening Testing Mammogram Every 1 -2 years based on history and risk factors Starting at age 35 Pap Smear Ages 21-39 every 3 years Ages 23-65 every 5 years with HPV testing More frequent testing may be required based on results and history Colon Cancer Screening Every 1-10 years based on test performed, risk factors, and history Starting at age 102 Bone Density Screening Every 2-10 years based on history Starting at age 69 for women Recommendations for men differ based on medication usage, history, and risk factors AAA Screening One time ultrasound Men 30-10 years old who have every smoked Lung Cancer Screening Low Dose Lung CT every 12 months Age 20-80 years with a 30 pack-year smoking history who still smoke or who have quit within the last 15 years   Screening Labs Routine  Labs: Complete Blood Count (CBC), Complete Metabolic Panel (CMP), Cholesterol (Lipid Panel) Every 6-12 months based on history and medications May be recommended more frequently based on current conditions or previous results Hemoglobin A1c Lab Every 3-12 months based on history and previous results Starting at age 24 or earlier with diagnosis of diabetes, high cholesterol, BMI >26, and/or risk factors Frequent monitoring for patients with diabetes to ensure blood sugar control Thyroid Panel (TSH w/ T3 & T4) Every 6 months based on history, symptoms, and risk factors May be repeated more often if on medication HIV One time testing for all patients 23 and older May be repeated more frequently for patients with increased risk factors or exposure Hepatitis C One time testing for all patients 47 and older May be repeated more frequently for patients with increased risk factors or exposure Gonorrhea, Chlamydia Every 12 months for all sexually active persons 13-24 years Additional monitoring may be recommended for those who are considered high risk or who have  symptoms PSA Men 72-66 years old with risk factors Additional screening may be recommended from age 2-69 based on risk factors, symptoms, and history   Vaccine Recommendations Tetanus Booster All adults every 10 years Flu Vaccine All patients 6 months and older every year COVID Vaccine All patients 12 years and older Initial dosing with booster May recommend additional booster based on age and health history HPV Vaccine 2 doses all patients age 56-26 Dosing may be considered for patients over 26 Shingles Vaccine (Shingrix) 2 doses all adults 55 years and older Pneumonia (Pneumovax 23) All adults 65 years and older May recommend earlier dosing based on health history Pneumonia (Prevnar 16) All adults 65 years and older Dosed 1 year after Pneumovax 23   Additional Screening, Testing, and Vaccinations may be recommended on an individualized basis based on family history, health history, risk factors, and/or exposure.  __________________________________________________________   Diet Recommendations for All Patients   I recommend that all patients maintain a diet low in saturated fats, carbohydrates, and cholesterol. While this can be challenging at first, it is not impossible and small changes can make big differences.  Things to try: Decreasing the amount of soda, sweet tea, and/or juice to one or less per day and replace with water While water is always the first choice, if you do not like water you may consider adding a water additive without sugar to improve the taste other sugar free drinks Replace potatoes with a brightly colored vegetable at dinner Use healthy oils, such as canola oil or olive oil, instead of butter or hard margarine Limit your bread intake to two pieces or less a day Replace regular pasta with  low carb pasta options Bake, broil, or grill foods instead of frying Monitor portion sizes  Eat smaller, more frequent meals throughout the day instead of large  meals   An important thing to remember is, if you love foods that are not great for your health, you don't have to give them up completely. Instead, allow these foods to be a reward when you have done well. Allowing yourself to still have special treats every once in a while is a nice way to tell yourself thank you for working hard to keep yourself healthy.    Also remember that every day is a new day. If you have a bad day and "fall off the wagon", you can still climb right back up and keep moving along on your journey!   We have resources available to help you!  Some websites that may be helpful include: www.http://www.wall-moore.info/        Www.VeryWellFit.com _____________________________________________________________   Activity Recommendations for All Patients   I recommend that all adults get at least 20 minutes of moderate physical activity that elevates your heart rate at least 5 days out of the week.  Some examples include: Walking or jogging at a pace that allows you to carry on a conversation Cycling (stationary bike or outdoors) Water aerobics Yoga Weight lifting Dancing If physical limitations prevent you from putting stress on your joints, exercise in a pool or seated in a chair are excellent options.   Do determine your MAXIMUM heart rate for activity: YOUR AGE - 220 = MAX HeartRate    Remember! Do not push yourself too hard.  Start slowly and build up your pace, speed, weight, time in exercise, etc.  Allow your body to rest between exercise and get good sleep. You will need more water than normal when you are exerting yourself. Do not wait until you are thirsty to drink. Drink with a purpose of getting in at least 8, 8 ounce glasses of water a day plus more depending on how much you exercise and sweat.      If you begin to develop dizziness, chest pain, abdominal pain, jaw pain, shortness of breath, headache, vision changes, lightheadedness, or other concerning symptoms, stop the  activity and allow your body to rest. If your symptoms are severe, seek emergency evaluation immediately. If your symptoms are concerning, but not severe, please let us know so that we can recommend further evaluation.

## 2024-04-05 NOTE — Progress Notes (Signed)
 Subjective:   Regina Macias [birth year]  04/05/2024   CC: Chief Complaint  Patient presents with   Annual Exam    HPI: Regina Macias is a 33 y.o. female who presents for a routine health maintenance exam.  Fasting labs collected today.   HEALTH SCREENINGS: - Vision Screening: up to date - Dental Visits: up to date - Pap smear: up to date, 06/16/2022 ASCUS with neg hrHPV  - Breast Exam: up to date - STD Screening: Declined - Mammogram (40+): Not applicable  - Colonoscopy (45+): Not applicable  - Bone Density (65+ or under 65 with predisposing conditions): Not applicable  - Lung CA screening with low-dose CT:  Not applicable Adults age 41-80 who are current cigarette smokers or quit within the last 15 years. Must have 20 pack year history.   Depression and Anxiety Screen done today and results listed below:     04/05/2024    9:27 AM 02/17/2024    1:38 PM  Depression screen PHQ 2/9  Decreased Interest 0 0  Down, Depressed, Hopeless 0 0  PHQ - 2 Score 0 0  Altered sleeping 1 2  Tired, decreased energy 2 1  Change in appetite 0 0  Feeling bad or failure about yourself  0 0  Trouble concentrating 0 0  Moving slowly or fidgety/restless 0 0  Suicidal thoughts 0 0  PHQ-9 Score 3 3  Difficult doing work/chores  Not difficult at all      04/05/2024    9:28 AM 02/17/2024    1:38 PM  GAD 7 : Generalized Anxiety Score  Nervous, Anxious, on Edge 0 0  Control/stop worrying 0 0  Worry too much - different things 0 1  Trouble relaxing 0 1  Restless 0 0  Easily annoyed or irritable 0 1  Afraid - awful might happen 0 0  Total GAD 7 Score 0 3  Anxiety Difficulty Not difficult at all Not difficult at all    IMMUNIZATIONS: - Tdap: Tetanus vaccination status reviewed: last tetanus booster within 10 years. - HPV: discussed  - Influenza: N/A - Prevnar 20: Administered today - Zostavax (50+): Not applicable   Past medical history, surgical history, medications, allergies,  family history and social history reviewed with patient today and changes made to appropriate areas of the chart.   Past Medical History:  Diagnosis Date   Anxiety    Asthma    Depression    Endometriosis     Past Surgical History:  Procedure Laterality Date   EXTERNAL EAR SURGERY      Current Outpatient Medications on File Prior to Visit  Medication Sig   escitalopram (LEXAPRO) 20 MG tablet Take 20 mg by mouth daily.   loratadine (CLARITIN) 10 MG tablet Take 10 mg by mouth daily.   norethindrone-ethinyl estradiol (VYFEMLA) 0.4-35 MG-MCG tablet Take 1 tablet by mouth daily.   No current facility-administered medications on file prior to visit.    Allergies  Allergen Reactions   Kenalog  [Triamcinolone  Acetonide] Rash   Triamcinolone  Rash     Social History   Socioeconomic History   Marital status: Single    Spouse name: Not on file   Number of children: Not on file   Years of education: Not on file   Highest education level: Associate degree: occupational, Scientist, product/process development, or vocational program  Occupational History   Not on file  Tobacco Use   Smoking status: Never   Smokeless tobacco: Never  Vaping Use   Vaping  status: Never Used  Substance and Sexual Activity   Alcohol use: Yes    Alcohol/week: 5.0 standard drinks of alcohol    Types: 5 Standard drinks or equivalent per week    Comment: occ   Drug use: No   Sexual activity: Yes    Birth control/protection: Pill  Other Topics Concern   Not on file  Social History Narrative   Not on file   Social Drivers of Health   Financial Resource Strain: Low Risk  (04/04/2024)   Overall Financial Resource Strain (CARDIA)    Difficulty of Paying Living Expenses: Not very hard  Food Insecurity: No Food Insecurity (04/04/2024)   Hunger Vital Sign    Worried About Running Out of Food in the Last Year: Never true    Ran Out of Food in the Last Year: Never true  Transportation Needs: No Transportation Needs (04/04/2024)    PRAPARE - Administrator, Civil Service (Medical): No    Lack of Transportation (Non-Medical): No  Physical Activity: Sufficiently Active (04/04/2024)   Exercise Vital Sign    Days of Exercise per Week: 3 days    Minutes of Exercise per Session: 60 min  Stress: No Stress Concern Present (04/04/2024)   [employer] of Occupational Health - Occupational Stress Questionnaire    Feeling of Stress: Not at all  Social Connections: Moderately Isolated (04/04/2024)   Social Connection and Isolation Panel    Frequency of Communication with Friends and Family: Three times a week    Frequency of Social Gatherings with Friends and Family: Three times a week    Attends Religious Services: Never    Active Member of Clubs or Organizations: No    Attends Engineer, structural: Not on file    Marital Status: Living with partner  Intimate Partner Violence: Not on file   Social History   Tobacco Use  Smoking Status Never  Smokeless Tobacco Never   Social History   Substance and Sexual Activity  Alcohol Use Yes   Alcohol/week: 5.0 standard drinks of alcohol   Types: 5 Standard drinks or equivalent per week   Comment: occ    Family History  Problem Relation Age of Onset   Breast cancer Mother        lumpectomy, around age 66s   Anxiety disorder Mother    Cancer Mother    Depression Mother    Asthma Father    Breast cancer Paternal Grandmother    Cancer Paternal Grandmother      ROS: Denies fever, fatigue, unexplained weight loss/gain, chest pain, SHOB, and palpitations. Denies neurological deficits, gastrointestinal or genitourinary complaints, and skin changes.   Objective:   Today's Vitals   04/05/24 0922  BP: 124/87  Pulse: 63  Temp: 97.8 F (36.6 C)  TempSrc: Oral  SpO2: 98%  Weight: 145 lb (65.8 kg)  Height: 5' 1 (1.549 m)    GENERAL APPEARANCE: Well-appearing, in NAD. Well nourished.  SKIN: Pink, warm and dry. Turgor normal. No rash, lesion,  ulceration, or ecchymoses. Hair evenly distributed.  HEENT: HEAD: Normocephalic.  EYES: PERRLA. EOMI. Lids intact w/o defect. Sclera white, Conjunctiva pink w/o exudate.  EARS: External ear w/o redness, swelling, masses or lesions. EAC clear. TM's intact, translucent w/o bulging, appropriate landmarks visualized. Appropriate acuity to conversational tones.  NOSE: Septum midline w/o deformity. Nares patent, mucosa pink and non-inflamed w/o drainage. No sinus tenderness.  THROAT: Uvula midline. Oropharynx clear. Tonsils non-inflamed w/o exudate. Oral mucosa pink and moist.  NECK: Supple, Trachea midline. Full ROM w/o pain or tenderness. No lymphadenopathy. Thyroid non-tender w/o enlargement or palpable masses.  BREASTS: Breasts pendulous, symmetrical, and w/o palpable masses. Nipples everted and w/o discharge. No rash or skin retraction. No axillary or supraclavicular lymphadenopathy.  RESPIRATORY: Chest wall symmetrical w/o masses. Respirations even and non-labored. Breath sounds clear to auscultation bilaterally. No wheezes, rales, rhonchi, or crackles. CARDIAC: S1, S2 present, regular rate and rhythm. No gallops, murmurs, rubs, or clicks. PMI w/o lifts, heaves, or thrills. No carotid bruits. Capillary refill <2 seconds. Peripheral pulses 2+ bilaterally. GI: Abdomen soft w/o distention. Normoactive bowel sounds. No palpable masses or tenderness. No guarding or rebound tenderness. Liver and spleen w/o tenderness or enlargement. No CVA tenderness.  GU: External genitalia without erythema, lesions, or masses. No lymphadenopathy. Vaginal mucosa pink and moist without exudate, lesions, or ulcerations. Cervix pink without discharge. Cervical os closed. Uterus and adnexae palpable, not enlarged, and w/o tenderness. No palpable masses.  MSK: Muscle tone and strength appropriate for age, w/o atrophy or abnormal movement.  EXTREMITIES: Active ROM intact, w/o tenderness, crepitus, or contracture. No obvious joint  deformities or effusions. No clubbing, edema, or cyanosis.  NEUROLOGIC: CN's II-XII intact. Motor strength symmetrical with no obvious weakness. No sensory deficits. DTR's 2+ symmetric bilaterally. Steady, even gait.  PSYCH/MENTAL STATUS: Alert, oriented x 3. Cooperative, appropriate mood and affect.   Chaperoned by Hershell Lose, CMA   Results for orders placed or performed in visit on 04/05/24  Pap IG and HPV (high risk) DNA detection   Collection Time: 06/16/22 12:00 AM  Result Value Ref Range   Negative      Assessment & Plan:   1. Wellness examination (Primary) - Encouraged a healthy well-balanced diet. Patient may adjust caloric intake to maintain or achieve ideal body weight. May reduce intake of dietary saturated fat and total fat and have adequate dietary potassium and calcium preferably from fresh fruits, vegetables, and low-fat dairy products.   - Advised to avoid cigarette smoking. - Discussed with the patient that most people either abstain from alcohol or drink within safe limits (<=14/week and <=4 drinks/occasion for males, <=7/weeks and <= 3 drinks/occasion for females) and that the risk for alcohol disorders and other health effects rises proportionally with the number of drinks per week and how often a drinker exceeds daily limits. - Discussed cessation/primary prevention of drug use and availability of treatment for abuse.  - Discussed sexually transmitted diseases, avoidance of unintended pregnancy and contraceptive alternatives. - Stressed the importance of regular exercise - Injury prevention: Discussed safety belts, safety helmets, smoke detector, smoking near bedding or upholstery.  - Dental health: Discussed importance of regular tooth brushing, flossing, and dental visits.  NEXT PREVENTATIVE PHYSICAL DUE IN 1 YEAR. - CBC with Differential/Platelet - Comprehensive metabolic panel with GFR - Hemoglobin A1c - Lipid panel - TSH Rfx on Abnormal to Free T4  2.  Vitamin B12 deficiency Will check vitamin B12 level today due to history of deficiency.  - Vitamin B12  3. Screening mammogram for breast cancer Mother was diagnosed with breast cancer this year and had a lumpectomy. Breast exam performed with chaperone, Hershell Lose, CMA present. Right breast tenderness and lump noted in the 7 o'clock position and left breast tenderness with lump present in the 2 o'clock position. No enlarged axillary lymph nodes palpated. Will re-order bilateral breast ultrasound, since patient has not been contacted when previous order was placed.  - US  LIMITED ULTRASOUND INCLUDING AXILLA RIGHT BREAST; Future -  US  LIMITED ULTRASOUND INCLUDING AXILLA LEFT BREAST ; Future  4. Painful lumpy left breast See #3 - US  LIMITED ULTRASOUND INCLUDING AXILLA LEFT BREAST ; Future  5. Painful lumpy right breast See #3 - US  LIMITED ULTRASOUND INCLUDING AXILLA RIGHT BREAST; Future  6. Need for prophylactic vaccination against Streptococcus pneumoniae (pneumococcus) Discussed benefit of pneumoniae vaccine. Patient agreeable. Based on CDC recommendations, since she has received PPSV23 more than one year prior, she would be eligible for PCV 15, 20 or 21. Will give PCV20 today. VIS information provided to patient.   Return in about 1 year (around 04/05/2025) for Physical with fasting labs.  Patient to reach out to office if new, worrisome, or unresolved symptoms arise or if no improvement in patient's condition. Patient verbalized understanding and is agreeable to treatment plan. All questions answered to patient's satisfaction.   Wilhelmena Hanson, FNP

## 2024-04-06 LAB — COMPREHENSIVE METABOLIC PANEL WITH GFR
ALT: 11 IU/L (ref 0–32)
AST: 21 IU/L (ref 0–40)
Albumin: 3.9 g/dL (ref 3.9–4.9)
Alkaline Phosphatase: 70 IU/L (ref 44–121)
BUN/Creatinine Ratio: 13 (ref 9–23)
BUN: 11 mg/dL (ref 6–20)
Bilirubin Total: 0.2 mg/dL (ref 0.0–1.2)
CO2: 21 mmol/L (ref 20–29)
Calcium: 8.8 mg/dL (ref 8.7–10.2)
Chloride: 104 mmol/L (ref 96–106)
Creatinine, Ser: 0.82 mg/dL (ref 0.57–1.00)
Globulin, Total: 2.7 g/dL (ref 1.5–4.5)
Glucose: 87 mg/dL (ref 70–99)
Potassium: 4.5 mmol/L (ref 3.5–5.2)
Sodium: 139 mmol/L (ref 134–144)
Total Protein: 6.6 g/dL (ref 6.0–8.5)
eGFR: 96 mL/min/{1.73_m2} (ref >59)

## 2024-04-06 LAB — CBC WITH DIFFERENTIAL/PLATELET
Basophils Absolute: 0.1 10*3/uL (ref 0.0–0.2)
Basos: 1 %
EOS (ABSOLUTE): 0.4 10*3/uL (ref 0.0–0.4)
Eos: 7 %
Hematocrit: 41.4 % (ref 34.0–46.6)
Hemoglobin: 13.4 g/dL (ref 11.1–15.9)
Immature Grans (Abs): 0 10*3/uL (ref 0.0–0.1)
Immature Granulocytes: 0 %
Lymphocytes Absolute: 2.2 10*3/uL (ref 0.7–3.1)
Lymphs: 39 %
MCH: 30.8 pg (ref 26.6–33.0)
MCHC: 32.4 g/dL (ref 31.5–35.7)
MCV: 95 fL (ref 79–97)
Monocytes Absolute: 0.4 10*3/uL (ref 0.1–0.9)
Monocytes: 7 %
Neutrophils Absolute: 2.7 10*3/uL (ref 1.4–7.0)
Neutrophils: 46 %
Platelets: 281 10*3/uL (ref 150–450)
RBC: 4.35 x10E6/uL (ref 3.77–5.28)
RDW: 12.1 % (ref 11.7–15.4)
WBC: 5.8 10*3/uL (ref 3.4–10.8)

## 2024-04-06 LAB — HEMOGLOBIN A1C
Est. average glucose Bld gHb Est-mCnc: 100 mg/dL
Hgb A1c MFr Bld: 5.1 % (ref 4.8–5.6)

## 2024-04-06 LAB — LIPID PANEL
Chol/HDL Ratio: 2.9 ratio (ref 0.0–4.4)
Cholesterol, Total: 180 mg/dL (ref 100–199)
HDL: 63 mg/dL (ref >39)
LDL Chol Calc (NIH): 105 mg/dL — ABNORMAL HIGH (ref 0–99)
Triglycerides: 66 mg/dL (ref 0–149)
VLDL Cholesterol Cal: 12 mg/dL (ref 5–40)

## 2024-04-06 LAB — TSH RFX ON ABNORMAL TO FREE T4: TSH: 2.94 u[IU]/mL (ref 0.450–4.500)

## 2024-04-06 LAB — VITAMIN B12: Vitamin B-12: 517 pg/mL (ref 232–1245)

## 2024-04-10 ENCOUNTER — Ambulatory Visit: Payer: Self-pay | Admitting: Family Medicine

## 2024-04-12 ENCOUNTER — Other Ambulatory Visit: Payer: Self-pay | Admitting: *Deleted

## 2024-04-12 ENCOUNTER — Inpatient Hospital Stay
Admission: RE | Admit: 2024-04-12 | Discharge: 2024-04-12 | Disposition: A | Discharge status: Home / Self Care | Payer: Self-pay | Source: Ambulatory Visit | Attending: Family Medicine | Admitting: Family Medicine

## 2024-04-12 ENCOUNTER — Inpatient Hospital Stay
Admission: RE | Admit: 2024-04-12 | Discharge: 2024-04-12 | Disposition: A | Discharge status: Home / Self Care | Payer: Self-pay | Source: Ambulatory Visit | Attending: Family Medicine

## 2024-04-12 DIAGNOSIS — Z1231 Encounter for screening mammogram for malignant neoplasm of breast: Secondary | ICD-10-CM

## 2024-04-19 ENCOUNTER — Inpatient Hospital Stay: Admission: RE | Admit: 2024-04-19 | Source: Ambulatory Visit

## 2024-04-19 ENCOUNTER — Other Ambulatory Visit

## 2024-04-19 ENCOUNTER — Ambulatory Visit
Admission: RE | Admit: 2024-04-19 | Discharge: 2024-04-19 | Disposition: A | Discharge status: Home / Self Care | Source: Ambulatory Visit | Attending: Family Medicine | Admitting: Family Medicine

## 2024-04-19 ENCOUNTER — Inpatient Hospital Stay
Admission: RE | Admit: 2024-04-19 | Discharge: 2024-04-19 | Discharge status: Home / Self Care | Source: Ambulatory Visit | Attending: Family Medicine

## 2024-04-19 DIAGNOSIS — N6310 Unspecified lump in the right breast, unspecified quadrant: Secondary | ICD-10-CM | POA: Diagnosis present

## 2024-04-19 DIAGNOSIS — Z1231 Encounter for screening mammogram for malignant neoplasm of breast: Secondary | ICD-10-CM

## 2024-04-19 DIAGNOSIS — N6320 Unspecified lump in the left breast, unspecified quadrant: Secondary | ICD-10-CM | POA: Diagnosis present

## 2024-04-19 DIAGNOSIS — N644 Mastodynia: Secondary | ICD-10-CM | POA: Insufficient documentation

## 2024-04-27 ENCOUNTER — Ambulatory Visit: Payer: Self-pay | Admitting: Family Medicine

## 2024-06-09 ENCOUNTER — Telehealth: Payer: Self-pay | Admitting: Neurosurgery

## 2024-06-09 NOTE — Telephone Encounter (Signed)
 Patient left a voicemail during lunch. 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 She is going to contact [facility] and have them send over a referral including her records and have them mail a disc of her imaging.

## 2024-06-20 ENCOUNTER — Ambulatory Visit: Admitting: Family Medicine

## 2024-06-20 ENCOUNTER — Encounter: Payer: Self-pay | Admitting: Family Medicine

## 2024-06-20 VITALS — BP 118/73 | HR 84 | Resp 16 | Ht 61.0 in | Wt 147.8 lb

## 2024-06-20 DIAGNOSIS — T7840XA Allergy, unspecified, initial encounter: Secondary | ICD-10-CM

## 2024-06-20 DIAGNOSIS — R600 Localized edema: Secondary | ICD-10-CM

## 2024-06-20 MED ORDER — EPINEPHRINE 0.3 MG/0.3ML IJ SOAJ: 0.3000 mg | INTRAMUSCULAR | 2 refills | Status: AC | PRN

## 2024-06-20 NOTE — Patient Instructions (Signed)
 MyChart:  For all urgent or time sensitive needs we ask that you please call the office to avoid delays. Our number is [phone] [phone]. MyChart is not constantly monitored and due to the large volume of messages a day, replies may take up to 72 business hours.   MyChart Policy: MyChart allows for you to see your visit notes, after visit summary, provider recommendations, lab and tests results, make an appointment, request refills, and contact your provider or the office for non-urgent questions or concerns. Providers are seeing patients during normal business hours and do not have built in time to review MyChart messages.  We ask that you allow a minimum of 3 business days for responses to [employer]. For this reason, please do not send urgent requests through MyChart. Please call the office at [phone]. New and ongoing conditions may require a visit. We have virtual and in person visit available for your convenience.  Complex MyChart concerns may require a visit. Your provider may request you schedule a virtual or in person visit to ensure we are providing the best care possible. MyChart messages sent after 11:00 AM on Friday will not be received by the provider until Monday morning.    Lab and Test Results: You will receive your lab and test results on MyChart as soon as they are completed and results have been sent by the lab or testing facility. Due to this service, you will receive your results BEFORE your provider.  I review lab and tests results each morning prior to seeing patients. Some results require collaboration with other providers to ensure you are receiving the most appropriate care. For this reason, we ask that you please allow a minimum of 3-5 business days from the time the ALL results have been received for your provider to receive and review lab and test results and contact you about these.  Most lab and test result comments from the provider will be sent through MyChart.  Your provider may recommend changes to the plan of care, follow-up visits, repeat testing, ask questions, or request an office visit to discuss these results. You may reply directly to this message or call the office at [phone] to provide information for the provider or set up an appointment. In some instances, you will be called with test results and recommendations. Please let us  know if this is preferred and we will make note of this in your chart to provide this for you.    If you have not heard a response to your lab or test results in 5 business days from all results returning to MyChart, please call the office to let us  know. We ask that you please avoid calling prior to this time unless there is an emergent concern. Due to high call volumes, this can delay the resulting process.   After Hours: For all non-emergency after hours needs, please call the office at [phone] and select the option to reach the on-call provider service. On-call services are shared between multiple [place] offices and therefore it will not be possible to speak directly with your provider. On-call providers may provide medical advice and recommendations, but are unable to provide refills for maintenance medications.  For all emergency or urgent medical needs after normal business hours, we recommend that you seek care at the closest Urgent Care or Emergency Department to ensure appropriate treatment in a timely manner.  [facility] [place] has a 24 hour emergency room located on the ground floor for your  convenience.    Urgent Concerns During the Business Day Providers are seeing patients from 8AM to 5PM, Monday through Thursday, and 8AM to 12PM on Friday with a busy schedule and are most often not able to respond to non-urgent calls until the end of the day or the next business day. If you should have URGENT concerns during the day, please call and speak to the nurse or schedule a same day  appointment so that we can address your concern without delay.    Thank you, again, for choosing me as your health care partner. I appreciate your trust and look forward to learning more about you.    Evalene Arts, FNP-C

## 2024-06-20 NOTE — Progress Notes (Unsigned)
 Acute Care Office Visit  Subjective:   Regina Macias 15-Jan-1991 06/20/2024  Chief Complaint  Patient presents with   Allergies    HPI:  Discussed the use of AI scribe software for clinical note transcription with the patient, who gave verbal consent to proceed.  History of Present Illness   Regina Macias is a 33 year old female who presents with an allergic reaction following multiple yellow jacket stings.  She experienced an allergic reaction after being stung three or four times by yellow jackets while mowing the lawn on Sunday. The stings were primarily on the back of her leg and once on her hand. Shortly after, she felt unwell, experienced nausea, and started vomiting. She developed hives on the back of her head and all over her back, which persisted under her bra line. She reported throat tightness, making it difficult to swallow, and had trouble breathing. Her ears became swollen, resembling 'cauliflower ears,' and she had blotches and bumps on the back of her head. Her bottom lip swelled slightly, and she had bumps under her mouth and chin.  This was the first time she had such a severe reaction, despite previous yellow jacket stings without incident. Her right leg remains swollen, and she reports significant tightness in the area. The hives on her upper back have mostly resolved.  She takes a generic form of Claritin every night and took two doses of Benadryl following the incident, which helped her sleep despite the itching. She did not take Benadryl the morning of the visit to avoid drowsiness at work.  The following portions of the patient's history were reviewed and updated as appropriate: past medical history, past surgical history, family history, social history, allergies, medications, and problem list.   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     escitalopram (LEXAPRO) 20 MG tablet Take 20 mg by mouth daily.     loratadine (CLARITIN) 10 MG tablet Take 10 mg by mouth daily.     norethindrone-ethinyl estradiol (VYFEMLA) 0.4-35 MG-MCG tablet Take 1 tablet by mouth daily.     No facility-administered medications prior to visit.   Allergies  Allergen Reactions   Yellow Jacket Venom Anaphylaxis   Kenalog  [Triamcinolone  Acetonide] Rash   Triamcinolone  Rash   ROS: A complete ROS was performed with pertinent positives/negatives noted in the HPI. The remainder of the ROS  are negative.    Objective:   Today's Vitals   06/20/24 1347  BP: 118/73  Pulse: 84  Resp: 16  SpO2: 98%  Weight: 147 lb 12.8 oz (67 kg)  Height: 5' 1 (1.549 m)  PainSc: 0-No pain   Physical Exam Vitals reviewed.  Constitutional:      Appearance: Normal appearance.  Cardiovascular:     Rate and Rhythm: Normal rate and regular rhythm.     Pulses: Normal pulses.     Heart sounds: Normal heart sounds.  Pulmonary:     Effort: Pulmonary effort is normal.     Breath sounds: Normal breath sounds.   Skin:    Findings: Rash present. Rash is urticarial.      Neurological:     Mental Status: She is alert.  Psychiatric:        Mood and Affect: Mood normal.        Behavior: Behavior normal.      Assessment & Plan:   1. Allergic reaction, initial encounter (Primary) Acute anaphylactic reaction to multiple yellow jacket stings with severe symptoms requiring immediate intervention. Newly identified allergy to yellow jacket venom with exacerbated reaction due to multiple stings. Increased sensitivity noted due to multiple stings. Prescribed EpiPen  for emergency use. Offered referral to allergist for evaluation and potential testing. Advised to seek immediate medical attention if similar reaction occurs. Continue Benadryl as needed for symptom relief.  - EPINEPHrine  0.3 mg/0.3 mL IJ SOAJ injection; Inject 0.3 mg into the muscle as needed for anaphylaxis.  Dispense: 1 each; Refill: 2  Meds ordered this encounter  Medications   EPINEPHrine  0.3 mg/0.3 mL IJ SOAJ injection    Sig: Inject 0.3 mg into the muscle as needed for anaphylaxis.    Dispense:  1 each    Refill:  2    Supervising Provider:   ZIGLAR, SUSAN K [[id number]]   Return if symptoms worsen or fail to improve.    Patient to reach out to office if new, worrisome, or unresolved symptoms arise or if no improvement in patient's condition. Patient verbalized understanding and is agreeable to treatment plan. All questions answered to patient's satisfaction.    Evalene Arts, FNP

## 2024-06-22 ENCOUNTER — Encounter: Payer: Self-pay | Admitting: Family Medicine

## 2024-06-22 MED ORDER — HYDROXYZINE PAMOATE 25 MG PO CAPS: 25.0000 mg | ORAL_CAPSULE | Freq: Three times a day (TID) | ORAL | 0 refills | Status: AC | PRN

## 2024-06-22 NOTE — Addendum Note (Signed)
 Addended by: TOWANA SMALL on: 06/22/2024 03:57 PM   Modules accepted: Orders

## 2024-08-15 ENCOUNTER — Encounter: Payer: Self-pay | Admitting: Family Medicine

## 2024-08-16 ENCOUNTER — Other Ambulatory Visit: Payer: Self-pay | Admitting: Family Medicine

## 2024-08-16 DIAGNOSIS — T7840XS Allergy, unspecified, sequela: Secondary | ICD-10-CM

## 2024-09-07 ENCOUNTER — Ambulatory Visit: Payer: Self-pay | Admitting: Allergy & Immunology

## 2024-09-07 ENCOUNTER — Encounter: Payer: Self-pay | Admitting: Allergy & Immunology

## 2024-09-07 ENCOUNTER — Other Ambulatory Visit: Payer: Self-pay

## 2024-09-07 VITALS — BP 118/70 | HR 67 | Temp 98.0°F | Resp 17 | Ht 62.5 in | Wt 150.7 lb

## 2024-09-07 DIAGNOSIS — T63444D Toxic effect of venom of bees, undetermined, subsequent encounter: Secondary | ICD-10-CM | POA: Diagnosis not present

## 2024-09-07 DIAGNOSIS — J310 Chronic rhinitis: Secondary | ICD-10-CM | POA: Diagnosis not present

## 2024-09-07 DIAGNOSIS — J4520 Mild intermittent asthma, uncomplicated: Secondary | ICD-10-CM | POA: Diagnosis not present

## 2024-09-07 DIAGNOSIS — L508 Other urticaria: Secondary | ICD-10-CM

## 2024-09-07 NOTE — Progress Notes (Signed)
 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 Chronic rhinitis - Because of insurance stipulations, we cannot do skin testing on the same day as your first visit. - We are all working to fight this, but for now we need to do two separate visits.  - We will know more after we do testing at the next visit.  - The skin testing visit can be squeezed in at your convenience.  - Then we can make a more full plan to address all of your symptoms. - Be sure to stop your antihistamines for 3 days before this appointment.   2. 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 - We are also going to get a tryptase to rule out a mast cell disease.  - EpiPen  training reviewed. - Emergency Action Plan provided.   3. Mild intermittent asthma, uncomplicated - Lung testing looks excellent. - We are going to send in AirSupra instead of albuterol. - AirSupra contains an inhaled steroid combined with the albuterol, which helps the lungs use the albuterol more efficiently.   4. Chronic urticaria - Your history does not have any red flags such as fevers, joint pains, or permanent skin changes that would be concerning for a more serious cause of hives.  - We will get some labs to rule out serious causes of hives: alpha-gal panel, complete blood count, tryptase level, chronic urticaria panel, CMP, ESR, and CRP. - Chronic hives are often times a self limited process and will burn themselves out over 6-12 months, although this is not always the case.  - In the meantime, start suppressive dosing of antihistamines at the first sign of hives and continue for 2-3 days:    -  Morning: Zyrtec (cetirizine) 10mg     - Evening: Zyrtec (cetirizine) 10mg     - If the above is not working, try adding: Pepcid (famotidine) 20mg  TWICE DAILY - You can change this dosing at home, decreasing the dose as needed or increasing the dosing as needed.   5. Return in about 1 week (around 09/14/2024) for ALLERGY  TESTING (1-55). You can have the follow up appointment with Dr. Iva or a Nurse Practicioner (our Nurse Practitioners are excellent and always have Physician oversight!).     This note in its entirety was forwarded to the Provider who requested this consultation.  Subjective:   Regina Macias is a 33 y.o. female presenting today for evaluation of  Chief Complaint  Patient presents with   Allergic Reaction    Breaking out in hives all over face and neck. Have not noticed a specific food. Strung by yellow jackets about 3 months ago.    Asthma    Has to use it mostly after mowing    Regina Macias has a history of the following: Patient Active Problem List   Diagnosis Date Noted   Active asthma 03/02/2022   Anxiety 03/02/2022   Endometriosis 03/02/2022   Annual physical exam 11/29/2017   Dysmenorrhea 11/29/2017   Mild intermittent asthma with acute exacerbation 10/08/2015   Wrist pain 04/09/2014    History obtained from: chart review and patient.  Discussed the use of  AI scribe software for clinical note transcription with the patient and/or guardian, who gave verbal consent to proceed.  Regina Macias was referred by Towana Small, FNP.     Regina Macias is a 33 y.o. female presenting for evaluation of stinging insect allergies as well as asthma and environmental allergies.   Asthma/Respiratory Symptom History: She was on a nebulizer when she was younger. This has continued through her life. She uses albuterol only - mainly in the summer when she is mowing her lawn. She does keep it in her gym bag. She does not use it daily. She estimates that she gets a  refill every 2-3 months.   She has a history of asthma, which began in childhood and was initially managed with a nebulizer. Currently, she uses albuterol as needed, particularly during the summer or when engaging in physical activities like mowing the lawn or going to the gym. She does not use it daily and refills it every two to three months. She experiences nasal drainage at night but does not cough frequently.  Allergic Rhinitis Symptom History: She has a lot of drainage at night, but not much coughing. She has a constant runny nose and drainage. She takes a lot of Mucinex. She uses a generic allergy  pill, provided by her father, to manage nasal symptoms, but still experiences significant nasal drainage.  Skin Symptom History: Since the initial incident, she has had five or six episodes of hives occurring randomly, often during breakfast. These episodes involve itching and hives on her face, neck, and arms, but do not typically involve throat tightness. She uses Benadryl to manage these symptoms, which makes her extremely sleepy, impacting her ability to work.  Stinging Insect Symptom History: Approximately three months ago, she experienced a severe allergic reaction after running over a nest of yellow jackets with a lawnmower. She was stung multiple times and within fifteen minutes began vomiting, experienced throat closure, and developed hives. She did not seek hospital care at that time. She has not been allergy  tested before and has not had similar reactions to previous stings, including a sting a month prior to the severe reaction, which did not cause any issues.   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 She is active in the gym three to four days a week and enjoys dining out. She has a long-term dog and a boyfriend who is a insurance underwriter.   Otherwise, there is no history of other atopic diseases, including drug allergies, stinging insect allergies, or  contact dermatitis. There is no significant infectious history. Vaccinations are up to date.    Past Medical History: Patient Active Problem List   Diagnosis Date Noted   Active asthma 03/02/2022   Anxiety 03/02/2022   Endometriosis 03/02/2022   Annual physical exam 11/29/2017   Dysmenorrhea 11/29/2017   Mild intermittent asthma with acute exacerbation 10/08/2015   Wrist pain 04/09/2014    Medication List:  Allergies as of 09/07/2024       Reactions   Yellow Jacket Venom Anaphylaxis   Kenalog  [triamcinolone  Acetonide] Rash   Triamcinolone  Rash        Medication List        Accurate as of September 07, 2024 11:59 PM. If you have any questions, ask your nurse or doctor.          albuterol 108 (90 Base) MCG/ACT inhaler Commonly known as: VENTOLIN HFA Inhale 2 puffs into the lungs every 4 (four) hours  as needed.   EPINEPHrine  0.3 mg/0.3 mL Soaj injection Commonly known as: EPI-PEN Inject 0.3 mg into the muscle as needed for anaphylaxis.   escitalopram 20 MG tablet Commonly known as: LEXAPRO Take 20 mg by mouth daily.   hydrOXYzine  25 MG capsule Commonly known as: VISTARIL  Take 1-2 capsules (25-50 mg total) by mouth every 8 (eight) hours as needed.   loratadine 10 MG tablet Commonly known as: CLARITIN Take 10 mg by mouth daily.   Vyfemla 0.4-35 MG-MCG tablet Generic drug: norethindrone-ethinyl estradiol Take 1 tablet by mouth daily.        Birth History: non-contributory  Developmental History: non-contributory  Past Surgical History: Past Surgical History:  Procedure Laterality Date   EXTERNAL EAR SURGERY       Family History: Family History  Problem Relation Age of Onset   Breast cancer Mother        lumpectomy, around age 58s   Anxiety disorder Mother    Cancer Mother    Depression Mother    Allergic rhinitis Father    Asthma Father    Breast cancer Paternal Grandmother    Cancer Paternal Grandmother      Social History: Nerissa  lives at home with her boyfriend. They live in a house that is 32 years old. There is LVP throughout the home. There is electric heating and central cooling.  There is 1 dog and 3 snacks in the home.  There are no dust mite covers on the bedding.  There is no tobacco exposure.  She currently works in the warden/ranger doing investigation of fire scenes.  She has been there for 3 years.  There is no fume, chemical, or dust exposure.  There is no HEPA filter in the home.   Review of systems otherwise negative other than that mentioned in the HPI.    Objective:   Blood pressure 118/70, pulse 67, temperature 98 F (36.7 C), temperature source Temporal, resp. rate 17, height 5' 2.5 (1.588 m), weight 150 lb 11.2 oz (68.4 kg), SpO2 97%. Body mass index is 27.12 kg/m.     Physical Exam Vitals reviewed.  Constitutional:      Appearance: She is well-developed.     Comments: Lovely and talkative.   HENT:     Head: Normocephalic and atraumatic.     Right Ear: Tympanic membrane, ear canal and external ear normal. No drainage, swelling or tenderness. Tympanic membrane is not injected, scarred, erythematous, retracted or bulging.     Left Ear: Tympanic membrane, ear canal and external ear normal. No drainage, swelling or tenderness. Tympanic membrane is not injected, scarred, erythematous, retracted or bulging.     Nose: No nasal deformity, septal deviation, mucosal edema or rhinorrhea.     Right Sinus: No maxillary sinus tenderness or frontal sinus tenderness.     Left Sinus: No maxillary sinus tenderness or frontal sinus tenderness.     Mouth/Throat:     Lips: Pink.     Mouth: Mucous membranes are moist. Mucous membranes are not pale and not dry.     Pharynx: Uvula midline.     Comments: Mild cobblestoning noted.  Eyes:     General:        Right eye: No discharge.        Left eye: No discharge.     Conjunctiva/sclera: Conjunctivae normal.     Right eye: Right conjunctiva is not injected.  No chemosis.    Left eye: Left conjunctiva is not injected. No chemosis.  Pupils: Pupils are equal, round, and reactive to light.  Cardiovascular:     Rate and Rhythm: Normal rate and regular rhythm.     Heart sounds: Normal heart sounds.  Pulmonary:     Effort: Pulmonary effort is normal. No tachypnea, accessory muscle usage or respiratory distress.     Breath sounds: Normal breath sounds. No wheezing, rhonchi or rales.  Chest:     Chest wall: No tenderness.  Abdominal:     Tenderness: There is no abdominal tenderness. There is no guarding or rebound.  Lymphadenopathy:     Head:     Right side of head: No submandibular, tonsillar or occipital adenopathy.     Left side of head: No submandibular, tonsillar or occipital adenopathy.     Cervical: No cervical adenopathy.  Skin:    General: Skin is warm.     Capillary Refill: Capillary refill takes less than 2 seconds.     Coloration: Skin is not pale.     Findings: No abrasion, erythema, petechiae or rash. Rash is not papular, urticarial or vesicular.     Comments: No urticaria. Multiple tattoos.   Neurological:     Mental Status: She is alert.  Psychiatric:        Behavior: Behavior is cooperative.      Diagnostic studies:    Spirometry: results normal (FEV1: 2.56/88%, FVC: 3.76/108%, FEV1/FVC: 68%).    Spirometry consistent with normal pattern.    Allergy  Studies: labs sent instead with plans for skin testing at the next visit         Marty Shaggy, MD [facility] 

## 2024-09-07 NOTE — Patient Instructions (Addendum)
 1. Chronic rhinitis - Because of insurance stipulations, we cannot do skin testing on the same day as your first visit. - We are all working to fight this, but for now we need to do two separate visits.  - We will know more after we do testing at the next visit.  - The skin testing visit can be squeezed in at your convenience.  - Then we can make a more full plan to address all of your symptoms. - Be sure to stop your antihistamines for 3 days before this appointment.   2. 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 - We are also going to get a tryptase to rule out a mast cell disease.  - EpiPen  training reviewed. - Emergency Action Plan provided.   3. Mild intermittent asthma, uncomplicated - Lung testing looks excellent. - We are going to send in AirSupra instead of albuterol. - AirSupra contains an inhaled steroid combined with the albuterol, which helps the lungs use the albuterol more efficiently.   4. Chronic urticaria - Your history does not have any red flags such as fevers, joint pains, or permanent skin changes that would be concerning for a more serious cause of hives.  - We will get some labs to rule out serious causes of hives: alpha-gal panel, complete blood count, tryptase level, chronic urticaria panel, CMP, ESR, and CRP. - Chronic hives are often times a self limited process and will burn themselves out over 6-12 months, although this is not always the case.  - In the meantime, start suppressive dosing of antihistamines at the first sign of hives and continue for 2-3 days:    - Morning: Zyrtec (cetirizine) 10mg     - Evening: Zyrtec (cetirizine) 10mg     - If the above is not working, try adding: Pepcid (famotidine) 20mg  TWICE DAILY - You can change this dosing at home, decreasing the dose as needed or increasing the dosing as needed.   5. Return in about 1 week (around 09/14/2024) for ALLERGY  TESTING (1-55). You can have the follow up appointment with Dr. Iva or a Nurse Practicioner (our Nurse Practitioners are excellent and always have Physician oversight!).    Please inform us  of any Emergency Department visits, hospitalizations, or changes in symptoms. Call us  before going to the ED for breathing or allergy symptoms since we might be able to fit you in for a sick visit. Feel free to contact us  anytime with any questions, problems, or concerns.  It was a pleasure to meet you today!  Websites that have reliable patient information: 1. American Academy of Asthma, Allergy, and Immunology: www.aaaai.org 2. Food Allergy Research and Education (FARE): foodallergy.org 3. Mothers of Asthmatics: http://www.asthmacommunitynetwork.org 4. American College of Allergy, Asthma, and Immunology: www.acaai.org      "Like" us  on Facebook and Instagram for our latest updates!      A healthy democracy works best when [employer] participate! Make sure you are registered to vote! If you have moved or changed any of your contact information, you will need to get this updated before voting! Scan the QR codes below to learn more!

## 2024-09-10 ENCOUNTER — Encounter: Payer: Self-pay | Admitting: Allergy & Immunology

## 2024-09-12 LAB — HYMENOPTERA VENOM ALLERGY II

## 2024-09-13 ENCOUNTER — Ambulatory Visit: Payer: Self-pay | Admitting: Allergy & Immunology

## 2024-09-13 DIAGNOSIS — R7689 Other specified abnormal immunological findings in serum: Secondary | ICD-10-CM

## 2024-09-13 LAB — CMP14+EGFR
ALT: 12 IU/L (ref 0–32)
AST: 19 IU/L (ref 0–40)
Albumin: 4.4 g/dL (ref 3.9–4.9)
Alkaline Phosphatase: 94 IU/L (ref 41–116)
BUN/Creatinine Ratio: 18 (ref 9–23)
BUN: 14 mg/dL (ref 6–20)
Bilirubin Total: 0.3 mg/dL (ref 0.0–1.2)
CO2: 22 mmol/L (ref 20–29)
Calcium: 9.2 mg/dL (ref 8.7–10.2)
Chloride: 100 mmol/L (ref 96–106)
Creatinine, Ser: 0.8 mg/dL (ref 0.57–1.00)
Globulin, Total: 3.1 g/dL (ref 1.5–4.5)
Glucose: 88 mg/dL (ref 70–99)
Potassium: 4 mmol/L (ref 3.5–5.2)
Sodium: 135 mmol/L (ref 134–144)
Total Protein: 7.5 g/dL (ref 6.0–8.5)
eGFR: 98 mL/min/1.73 (ref >59)

## 2024-09-13 LAB — FANA STAINING PATTERNS: Nucleolar Pattern: 1:640 {titer} — ABNORMAL HIGH

## 2024-09-13 LAB — CBC WITH DIFF/PLATELET
Basophils Absolute: 0.1 x10E3/uL (ref 0.0–0.2)
Basos: 1 %
EOS (ABSOLUTE): 0.3 x10E3/uL (ref 0.0–0.4)
Eos: 4 %
Hematocrit: 41.7 % (ref 34.0–46.6)
Hemoglobin: 13.8 g/dL (ref 11.1–15.9)
Immature Grans (Abs): 0 x10E3/uL (ref 0.0–0.1)
Immature Granulocytes: 0 %
Lymphocytes Absolute: 2.5 x10E3/uL (ref 0.7–3.1)
Lymphs: 35 %
MCH: 30.3 pg (ref 26.6–33.0)
MCHC: 33.1 g/dL (ref 31.5–35.7)
MCV: 91 fL (ref 79–97)
Monocytes Absolute: 0.4 x10E3/uL (ref 0.1–0.9)
Monocytes: 5 %
Neutrophils Absolute: 3.9 x10E3/uL (ref 1.4–7.0)
Neutrophils: 55 %
Platelets: 300 x10E3/uL (ref 150–450)
RBC: 4.56 x10E6/uL (ref 3.77–5.28)
RDW: 12.5 % (ref 11.7–15.4)
WBC: 7.2 x10E3/uL (ref 3.4–10.8)

## 2024-09-13 LAB — THYROID ANTIBODIES (THYROPEROXIDASE & THYROGLOBULIN)
Thyroglobulin Antibody: <1 [IU]/mL (ref 0.0–0.9)
Thyroperoxidase Ab SerPl-aCnc: 13 [IU]/mL (ref 0–34)

## 2024-09-13 LAB — HYMENOPTERA VENOM ALLERGY II
Bumblebee: <0.1 kU/L
Hornet, White Face, IgE: 0.84 kU/L — AB
Hornet, Yellow, IgE: 0.31 kU/L — AB
I001-IgE Honeybee: <0.1 kU/L
I208-IgE Api m 1: <0.1 kU/L
I209-IgE Ves v 5: 0.32 kU/L — AB
I211-IgE Ves v 1: 4.18 kU/L — AB
I211-IgE Ves v 1: 9.79 kU/L — AB
I214-IgE Api m 2: <0.1 kU/L
I215-IgE Api m 3: <0.1 kU/L
I216-IgE Api m 5: 10.7 kU/L — AB
I217-IgE Api m 10: <0.1 kU/L
Reflex Information: 0.58 kU/L — AB
Reflex Information: 9.84 kU/L — AB
Tryptase: 6.6 ug/L (ref 2.2–13.2)

## 2024-09-13 LAB — ALLERGEN COMPONENT COMMENTS

## 2024-09-13 LAB — ALPHA-GAL PANEL
Allergen Lamb IgE: <0.1 kU/L
Beef IgE: <0.1 kU/L
IgE (Immunoglobulin E), Serum: 111 [IU]/mL (ref 6–495)
O215-IgE Alpha-Gal: <0.1 kU/L
Pork IgE: <0.1 kU/L

## 2024-09-13 LAB — SEDIMENTATION RATE: Sed Rate: 28 mm/h (ref 0–32)

## 2024-09-13 LAB — CHRONIC URTICARIA PD-BAT: Pooled Donor- BAT CU: 11.76 % — AB (ref 0.00–10.60)

## 2024-09-13 LAB — ANTINUCLEAR ANTIBODIES, IFA: ANA Titer 1: POSITIVE — AB

## 2024-09-13 LAB — C-REACTIVE PROTEIN: CRP: 18 mg/L — ABNORMAL HIGH (ref 0–10)

## 2024-09-21 ENCOUNTER — Encounter: Payer: Self-pay | Admitting: Family Medicine

## 2024-09-21 ENCOUNTER — Ambulatory Visit: Admitting: Family Medicine

## 2024-09-21 DIAGNOSIS — J302 Other seasonal allergic rhinitis: Secondary | ICD-10-CM | POA: Diagnosis not present

## 2024-09-21 DIAGNOSIS — T63444A Toxic effect of venom of bees, undetermined, initial encounter: Secondary | ICD-10-CM | POA: Insufficient documentation

## 2024-09-21 DIAGNOSIS — T63444D Toxic effect of venom of bees, undetermined, subsequent encounter: Secondary | ICD-10-CM

## 2024-09-21 DIAGNOSIS — J4520 Mild intermittent asthma, uncomplicated: Secondary | ICD-10-CM

## 2024-09-21 DIAGNOSIS — J3089 Other allergic rhinitis: Secondary | ICD-10-CM

## 2024-09-21 NOTE — Progress Notes (Unsigned)
   [address] [place] [address] Dept: [phone]  FOLLOW UP NOTE  Patient ID: [name], female    DOB: 09-22-1991  Age: 33 y.o. MRN: [id number] Date of Office Visit: 09/21/2024  Assessment  Chief Complaint: Allergy  Testing  HPI [name] is a 33 year old female who presents to the clinic for follow-up visit.  She was last seen in this clinic on 09/07/2024 by Dr. Iva as a new patient for evaluation of chronic rhinitis, venom allergy , asthma, and urticaria.  Lab work at that time was elevated to ANA for which she was referred to rheumatology.  Of note, alpha gal allergy  was negative and chronic urticaria panel was positive. Hymenoptera panel on 09/07/2024 was positive to white faced hornet, yellow hornet, and yellow jacket with tryptase within normal limits.  EpiPen  set is up-to-date.  At today's visit, she reports that she is feeling well overall with no cardiopulmonary, gastrointestinal, or integumentary symptoms.  She has not had any antihistamines over the last 3 days.  Her current medications are listed in the chart.  Drug Allergies:  Allergies  Allergen Reactions   Yellow Jacket Venom Anaphylaxis   Kenalog  [Triamcinolone  Acetonide] Rash   Triamcinolone  Rash    Diagnostics: Percutaneous environmental allergy  skin testing was positive to weed pollen, tree pollen, dust mite, cat, and dog with adequate controls  Intradermal environmental allergy  skin testing was positive to mold mix 2 and mld mix 4 with an adequate control  Assessment and Plan: 1. Seasonal and perennial allergic rhinitis   2. Toxic effect of venom of bees, undetermined intent, subsequent encounter   3. Mild intermittent asthma, uncomplicated     Patient Instructions  Allergic rhinitis Your skin testing was positive to weed pollen, tree pollen, dust mite, cat, and dog. Allergen avoidance measures are listed below. Continue an antihistamine once a day if needed for runny nose or  itch. Remember to rotate to a different antihistamine about every 3 months. Some examples of over the counter antihistamines include Zyrtec (cetirizine), Xyzal (levocetirizine), Allegra (fexofenadine), and Claritin (loratidine).  Continue Flonase 2 sprays in each nostril once a day if needed for a stuffy nose.  In the right nostril, point the applicator out toward the right ear. In the left nostril, point the applicator out toward the left ear Consider saline nasal rinses as needed for nasal symptoms. Use this before any medicated nasal sprays for best result Consider allergen immunotherapy if your symptoms are not well-controlled with the treatment plan as listed below  Asthma Continue Airsupra 2 puffs as needed.  Do not use this medication more than 12 puffs in a 24-hour time span  Stinging insect allergy  Continue to avoid stinging insects..  In case of an allergic reaction, take cetirizine 10 mg once every 12-24 hours, and if life-threatening symptoms occur, inject with EpiPen  0.3 mg.  Call the clinic if this treatment plan is not working well for you.  Follow up in 3 months or sooner if needed.   Return in about 3 months (around 12/20/2024), or if symptoms worsen or fail to improve.    Thank you for the opportunity to care for this patient.  Please do not hesitate to contact me with questions.  Arlean Mutter, FNP [facility]

## 2024-09-21 NOTE — Patient Instructions (Addendum)
 Allergic rhinitis Your skin testing was positive to weed pollen, tree pollen, dust mite, mold, cat, and dog. Allergen avoidance measures are listed below. Continue an antihistamine once a day if needed for runny nose or itch. Remember to rotate to a different antihistamine about every 3 months. Some examples of over the counter antihistamines include Zyrtec (cetirizine), Xyzal (levocetirizine), Allegra (fexofenadine), and Claritin (loratidine).  Continue Flonase 2 sprays in each nostril once a day if needed for a stuffy nose.  In the right nostril, point the applicator out toward the right ear. In the left nostril, point the applicator out toward the left ear Consider saline nasal rinses as needed for nasal symptoms. Use this before any medicated nasal sprays for best result Consider allergen immunotherapy if your symptoms are not well-controlled with the treatment plan as listed below  Asthma Continue Airsupra 2 puffs as needed.  Do not use this medication more than 12 puffs in a 24-hour time span  Stinging insect allergy  Continue to avoid stinging insects..  In case of an allergic reaction, take cetirizine 10 mg once every 12-24 hours, and if life-threatening symptoms occur, inject with EpiPen  0.3 mg.  Call the clinic if this treatment plan is not working well for you.  Follow up in 3 months or sooner if needed.  Control of Mold Allergen Mold and fungi can grow on a variety of surfaces provided certain temperature and moisture conditions exist.  Outdoor molds grow on plants, decaying vegetation and soil.  The major outdoor mold, Alternaria and Cladosporium, are found in very high numbers during hot and dry conditions.  Generally, a late Summer - Fall peak is seen for common outdoor fungal spores.  Rain will temporarily lower outdoor mold spore count, but counts rise rapidly when the rainy period ends.  The most important indoor molds are Aspergillus and Penicillium.  Dark, humid and poorly  ventilated basements are ideal sites for mold growth.  The next most common sites of mold growth are the bathroom and the kitchen.  Outdoor [employer] Use air conditioning and keep windows closed Avoid exposure to decaying vegetation. Avoid leaf raking. Avoid grain handling. Consider wearing a face mask if working in moldy areas.  Indoor Mold Control Maintain humidity below 50%. Clean washable surfaces with 5% bleach solution. Remove sources e.g. Contaminated carpets.  Reducing Pollen Exposure The [employer] , [employer] suggests the following steps to reduce your exposure to pollen during allergy  seasons. Do not hang sheets or clothing out to dry; pollen may collect on these items. Do not mow lawns or spend time around freshly cut grass; mowing stirs up pollen. Keep windows closed at night.  Keep car windows closed while driving. Minimize morning activities outdoors, a time when pollen counts are usually at their highest. Stay indoors as much as possible when pollen counts or humidity is high and on windy days when pollen tends to remain in the air longer. Use air conditioning when possible.  Many air conditioners have filters that trap the pollen spores. Use a HEPA room air filter to remove pollen form the indoor air you breathe.  Control of Cockroach Allergen Cockroach allergen has been identified as an important cause of acute attacks of asthma, especially in urban settings.  There are fifty-five species of cockroach that exist in the [place] , however only three, the American, German and Oriental species produce allergen that can affect patients with Asthma.  Allergens can be obtained from fecal particles, egg casings and  secretions from cockroaches.    Remove food sources. Reduce access to water. Seal access and entry points. Spray runways with 0.5-1% Diazinon or Chlorpyrifos Blow boric acid power under stoves and refrigerator. Place bait  stations (hydramethylnon) at feeding sites.   Control of Dog or Cat Allergen Avoidance is the best way to manage a dog or cat allergy . If you have a dog or cat and are allergic to dog or cats, consider removing the dog or cat from the home. If you have a dog or cat but don't want to find it a new home, or if your family wants a pet even though someone in the household is allergic, here are some strategies that may help keep symptoms at bay:  Keep the pet out of your bedroom and restrict it to only a few rooms. Be advised that keeping the dog or cat in only one room will not limit the allergens to that room. Don't pet, hug or kiss the dog or cat; if you do, wash your hands with soap and water. High-efficiency particulate air (HEPA) cleaners run continuously in a bedroom or living room can reduce allergen levels over time. Regular use of a high-efficiency vacuum cleaner or a central vacuum can reduce allergen levels. Giving your dog or cat a bath at least once a week can reduce airborne allergen.

## 2024-09-22 ENCOUNTER — Encounter: Payer: Self-pay | Admitting: Family Medicine

## 2024-10-17 ENCOUNTER — Encounter: Payer: Self-pay | Admitting: Family Medicine

## 2024-10-22 ENCOUNTER — Other Ambulatory Visit: Payer: Self-pay | Admitting: Family Medicine

## 2024-10-23 ENCOUNTER — Other Ambulatory Visit: Payer: Self-pay | Admitting: Family Medicine

## 2024-10-23 DIAGNOSIS — Z3009 Encounter for other general counseling and advice on contraception: Secondary | ICD-10-CM

## 2024-10-23 MED ORDER — VYFEMLA 0.4-35 MG-MCG PO TABS: 1.0000 | ORAL_TABLET | Freq: Every day | ORAL | 12 refills | Status: DC

## 2024-12-20 ENCOUNTER — Ambulatory Visit: Admitting: Allergy

## 2025-01-02 ENCOUNTER — Ambulatory Visit

## 2025-03-29 ENCOUNTER — Ambulatory Visit

## 2025-04-05 ENCOUNTER — Encounter: Admitting: Family Medicine
